# Patient Record
Sex: Female | Born: 1960 | Race: White | Hispanic: No | Smoking: Never smoker
Health system: Southern US, Community
[De-identification: ages and names within clinical notes are randomized; demographics above are authoritative.]

## PROBLEM LIST (undated history)

## (undated) DIAGNOSIS — E785 Hyperlipidemia, unspecified: Secondary | ICD-10-CM

## (undated) DIAGNOSIS — E079 Disorder of thyroid, unspecified: Secondary | ICD-10-CM

## (undated) DIAGNOSIS — E119 Type 2 diabetes mellitus without complications: Secondary | ICD-10-CM

## (undated) DIAGNOSIS — G2581 Restless legs syndrome: Secondary | ICD-10-CM

## (undated) HISTORY — DX: Disorder of thyroid, unspecified: E07.9

## (undated) HISTORY — DX: Restless legs syndrome: G25.81

## (undated) HISTORY — DX: Hyperlipidemia, unspecified: E78.5

## (undated) HISTORY — DX: Type 2 diabetes mellitus without complications: E11.9

---

## 1997-09-17 HISTORY — PX: TUBAL LIGATION: SHX77

## 2004-07-24 HISTORY — PX: OTHER SURGICAL HISTORY: SHX169

## 2005-03-08 ENCOUNTER — Ambulatory Visit: Payer: Self-pay | Admitting: Family Medicine

## 2006-09-17 HISTORY — PX: GASTRIC BYPASS: SHX52

## 2009-12-29 ENCOUNTER — Ambulatory Visit: Payer: Self-pay | Admitting: Unknown Physician Specialty

## 2010-03-07 ENCOUNTER — Ambulatory Visit: Payer: Self-pay

## 2013-08-13 ENCOUNTER — Emergency Department: Payer: Self-pay | Admitting: Emergency Medicine

## 2013-08-14 DIAGNOSIS — Z8679 Personal history of other diseases of the circulatory system: Secondary | ICD-10-CM | POA: Insufficient documentation

## 2013-08-14 DIAGNOSIS — S12000A Unspecified displaced fracture of first cervical vertebra, initial encounter for closed fracture: Secondary | ICD-10-CM | POA: Insufficient documentation

## 2013-11-04 LAB — HM PAP SMEAR

## 2014-04-23 ENCOUNTER — Ambulatory Visit: Payer: Self-pay | Admitting: Gastroenterology

## 2014-04-23 LAB — HM COLONOSCOPY

## 2014-04-26 LAB — PATHOLOGY REPORT

## 2015-01-05 LAB — BASIC METABOLIC PANEL
BUN: 7 mg/dL (ref 4–21)
Creatinine: 0.7 mg/dL (ref 0.5–1.1)
Glucose: 137 mg/dL
Potassium: 4.8 mmol/L (ref 3.4–5.3)
Sodium: 137 mmol/L (ref 137–147)

## 2015-01-05 LAB — HEPATIC FUNCTION PANEL: ALT: 17 U/L (ref 7–35)

## 2015-01-05 LAB — CBC AND DIFFERENTIAL
HCT: 42 % (ref 36–46)
Hemoglobin: 14.6 g/dL (ref 12.0–16.0)
Platelets: 356 10*3/uL (ref 150–399)
WBC: 8.5 10^3/mL

## 2015-01-05 LAB — LIPID PANEL
Cholesterol: 160 mg/dL (ref 0–200)
HDL: 50 mg/dL (ref 35–70)
LDL Cholesterol: 57 mg/dL
Triglycerides: 264 mg/dL — AB (ref 40–160)

## 2015-01-05 LAB — TSH: TSH: 1.48 u[IU]/mL (ref 0.41–5.90)

## 2015-01-05 LAB — HEMOGLOBIN A1C: Hemoglobin A1C: 7.2

## 2015-01-10 ENCOUNTER — Other Ambulatory Visit: Payer: Self-pay | Admitting: Family Medicine

## 2015-01-10 DIAGNOSIS — E279 Disorder of adrenal gland, unspecified: Principal | ICD-10-CM

## 2015-01-10 DIAGNOSIS — E278 Other specified disorders of adrenal gland: Secondary | ICD-10-CM

## 2015-01-19 ENCOUNTER — Ambulatory Visit
Admission: RE | Admit: 2015-01-19 | Discharge: 2015-01-19 | Disposition: A | Discharge status: Home / Self Care | Payer: BLUE CROSS/BLUE SHIELD | Source: Ambulatory Visit | Attending: Family Medicine | Admitting: Family Medicine

## 2015-01-19 DIAGNOSIS — K769 Liver disease, unspecified: Secondary | ICD-10-CM | POA: Diagnosis present

## 2015-01-19 DIAGNOSIS — N281 Cyst of kidney, acquired: Secondary | ICD-10-CM | POA: Diagnosis not present

## 2015-01-19 DIAGNOSIS — Z09 Encounter for follow-up examination after completed treatment for conditions other than malignant neoplasm: Secondary | ICD-10-CM | POA: Diagnosis present

## 2015-01-19 DIAGNOSIS — E279 Disorder of adrenal gland, unspecified: Secondary | ICD-10-CM

## 2015-01-19 DIAGNOSIS — E278 Other specified disorders of adrenal gland: Secondary | ICD-10-CM

## 2015-01-19 DIAGNOSIS — D3502 Benign neoplasm of left adrenal gland: Secondary | ICD-10-CM | POA: Diagnosis present

## 2015-01-19 MED ORDER — GADOBENATE DIMEGLUMINE 529 MG/ML IV SOLN
13.0000 mL | Freq: Once | INTRAVENOUS | Status: AC | PRN
  Administered 2015-01-19: 13 mL via INTRAVENOUS

## 2015-02-26 ENCOUNTER — Other Ambulatory Visit: Payer: Self-pay | Admitting: Family Medicine

## 2015-02-27 NOTE — Telephone Encounter (Signed)
Please call in   [facility] [phone]   [place]       Requested Medications     Medication name:  Name from pharmacy:  zolpidem (AMBIEN) 10 MG tablet ZOLPIDEM 10MG  TAB    Sig: TAKE ONE TABLET BY MOUTH AT BEDTIME AS NEEDED    Dispense: 30 tablet   Refills: 3   Start: 02/26/2015   Class: Normal    Requested on: 11/03/2014    01/31/2015 Order History and Details         Medication name:  Name from pharmacy:  FLUoxetine (PROZAC) 20 MG capsule FLUOXETINE 20MG  CAP    Sig: TAKE ONE CAPSULE BY MOUTH ONCE DAILY    Dispense: 90 capsule   Refills: 3

## 2015-03-01 NOTE — Telephone Encounter (Signed)
Rx phoned into pharmacy.

## 2015-03-27 ENCOUNTER — Other Ambulatory Visit: Payer: Self-pay | Admitting: Family Medicine

## 2015-05-29 ENCOUNTER — Other Ambulatory Visit: Payer: Self-pay | Admitting: Family Medicine

## 2015-06-30 ENCOUNTER — Other Ambulatory Visit: Payer: Self-pay | Admitting: Family Medicine

## 2015-06-30 NOTE — Telephone Encounter (Signed)
Please call in zolpidem  

## 2015-06-30 NOTE — Telephone Encounter (Signed)
Rx called in to pharmacy. 

## 2015-07-27 ENCOUNTER — Other Ambulatory Visit: Payer: Self-pay | Admitting: Family Medicine

## 2015-08-30 ENCOUNTER — Other Ambulatory Visit: Payer: Self-pay | Admitting: Family Medicine

## 2015-08-30 DIAGNOSIS — I10 Essential (primary) hypertension: Secondary | ICD-10-CM

## 2015-08-30 DIAGNOSIS — E119 Type 2 diabetes mellitus without complications: Secondary | ICD-10-CM

## 2015-08-30 DIAGNOSIS — E039 Hypothyroidism, unspecified: Secondary | ICD-10-CM

## 2015-08-30 DIAGNOSIS — E785 Hyperlipidemia, unspecified: Secondary | ICD-10-CM

## 2015-09-09 DIAGNOSIS — E1169 Type 2 diabetes mellitus with other specified complication: Secondary | ICD-10-CM | POA: Insufficient documentation

## 2015-09-09 DIAGNOSIS — E039 Hypothyroidism, unspecified: Secondary | ICD-10-CM | POA: Insufficient documentation

## 2015-09-09 DIAGNOSIS — I10 Essential (primary) hypertension: Secondary | ICD-10-CM | POA: Insufficient documentation

## 2015-09-09 DIAGNOSIS — E785 Hyperlipidemia, unspecified: Secondary | ICD-10-CM | POA: Insufficient documentation

## 2015-09-09 NOTE — Telephone Encounter (Signed)
Please advise patient she is due for follow up visit. Have sent 30 day refill for medications, be we need to see her before next refill. Thank.s

## 2015-09-09 NOTE — Telephone Encounter (Signed)
Last OV 12/2014  Thanks,   -Laura  

## 2015-09-22 DIAGNOSIS — G4700 Insomnia, unspecified: Secondary | ICD-10-CM | POA: Insufficient documentation

## 2015-09-22 DIAGNOSIS — G2581 Restless legs syndrome: Secondary | ICD-10-CM | POA: Insufficient documentation

## 2015-09-22 DIAGNOSIS — O24429 Gestational diabetes mellitus in childbirth, unspecified control: Secondary | ICD-10-CM | POA: Insufficient documentation

## 2015-09-22 DIAGNOSIS — S12000A Unspecified displaced fracture of first cervical vertebra, initial encounter for closed fracture: Secondary | ICD-10-CM | POA: Insufficient documentation

## 2015-09-22 DIAGNOSIS — R2231 Localized swelling, mass and lump, right upper limb: Secondary | ICD-10-CM | POA: Insufficient documentation

## 2015-09-22 DIAGNOSIS — E611 Iron deficiency: Secondary | ICD-10-CM | POA: Insufficient documentation

## 2015-09-22 DIAGNOSIS — J309 Allergic rhinitis, unspecified: Secondary | ICD-10-CM | POA: Insufficient documentation

## 2015-09-22 DIAGNOSIS — E278 Other specified disorders of adrenal gland: Secondary | ICD-10-CM | POA: Insufficient documentation

## 2015-09-22 DIAGNOSIS — B351 Tinea unguium: Secondary | ICD-10-CM | POA: Insufficient documentation

## 2015-09-22 DIAGNOSIS — Z8679 Personal history of other diseases of the circulatory system: Secondary | ICD-10-CM | POA: Insufficient documentation

## 2015-09-23 ENCOUNTER — Ambulatory Visit (INDEPENDENT_AMBULATORY_CARE_PROVIDER_SITE_OTHER): Payer: BLUE CROSS/BLUE SHIELD | Admitting: Family Medicine

## 2015-09-23 ENCOUNTER — Encounter: Payer: Self-pay | Admitting: Family Medicine

## 2015-09-23 VITALS — BP 118/82 | HR 86 | Temp 98.2°F | Resp 16 | Wt 148.0 lb

## 2015-09-23 DIAGNOSIS — E785 Hyperlipidemia, unspecified: Secondary | ICD-10-CM

## 2015-09-23 DIAGNOSIS — R059 Cough, unspecified: Secondary | ICD-10-CM

## 2015-09-23 DIAGNOSIS — I10 Essential (primary) hypertension: Secondary | ICD-10-CM | POA: Diagnosis not present

## 2015-09-23 DIAGNOSIS — E611 Iron deficiency: Secondary | ICD-10-CM

## 2015-09-23 DIAGNOSIS — E119 Type 2 diabetes mellitus without complications: Secondary | ICD-10-CM | POA: Diagnosis not present

## 2015-09-23 DIAGNOSIS — J329 Chronic sinusitis, unspecified: Secondary | ICD-10-CM | POA: Diagnosis not present

## 2015-09-23 DIAGNOSIS — E039 Hypothyroidism, unspecified: Secondary | ICD-10-CM | POA: Diagnosis not present

## 2015-09-23 DIAGNOSIS — R05 Cough: Secondary | ICD-10-CM | POA: Diagnosis not present

## 2015-09-23 LAB — POC INFLUENZA A&B (BINAX/QUICKVUE)
Influenza A, POC: NEGATIVE
Influenza B, POC: NEGATIVE

## 2015-09-23 MED ORDER — HYDROCOD POLST-CPM POLST ER 10-8 MG/5ML PO SUER: 5.0000 mL | Freq: Two times a day (BID) | ORAL | Status: DC | PRN

## 2015-09-23 MED ORDER — AZITHROMYCIN 250 MG PO TABS: ORAL_TABLET | ORAL | Status: AC

## 2015-09-23 NOTE — Progress Notes (Signed)
Patient ID: Barbara Harper, female   DOB: 15-Apr-1961, 55 y.o.   MRN: [id number]       Patient: Barbara Harper Female    DOB: 1961/01/04   54 y.o.   MRN: [id number] Visit Date: 09/23/2015  Today's Provider: Lelon Huh, MD   Chief Complaint  Patient presents with  . URI    for about 2 weeks   Subjective:    HPI Pt is here for URI symptoms. She reports that they have been going on for about 2 weeks on and off. She has cough that is keeping her up at night, has made her chest sore and has green sputum. She also has sinus congestion, fever, sore throat, ear pain, sinus pain, pressure and headaches.  She started having fever up to 101 last night. Has been taking Advil over night and this morning.     Allergies  Allergen Reactions  . Multivitamin  [Centrum]     in 20's due to trace element in MV that caused joint swelling per pt has resolved  . Penicillin G Hives   Previous Medications   FLUOXETINE (PROZAC) 20 MG CAPSULE    TAKE ONE CAPSULE BY MOUTH ONCE DAILY   GABAPENTIN (NEURONTIN) 300 MG CAPSULE    Take 1 capsule by mouth at bedtime.   LEVOTHYROXINE (SYNTHROID, LEVOTHROID) 125 MCG TABLET    TAKE ONE TABLET BY MOUTH ONCE DAILY   LISINOPRIL-HYDROCHLOROTHIAZIDE (PRINZIDE,ZESTORETIC) 10-12.5 MG TABLET    TAKE ONE TABLET BY MOUTH ONCE DAILY   LOVASTATIN (MEVACOR) 40 MG TABLET    TAKE ONE TABLET BY MOUTH ONCE DAILY   METFORMIN (GLUCOPHAGE) 1000 MG TABLET    TAKE ONE TABLET BY MOUTH TWICE DAILY *SCHEDULE OFFICE VISIT FOR FOLLOW UP*   MULTIPLE VITAMINS-MINERALS (MULTIVITAMIN ADULT PO)    Take 1 tablet by mouth daily.   VITAMIN B-12 (CYANOCOBALAMIN) 1000 MCG TABLET    Take 1 tablet by mouth daily.   ZOLPIDEM (AMBIEN) 10 MG TABLET    TAKE ONE TABLET BY MOUTH AT BEDTIME AS NEEDED    Review of Systems  Constitutional: Positive for fever and fatigue.  HENT: Positive for congestion, ear pain, postnasal drip, rhinorrhea, sinus pressure and sore throat.   Eyes: Negative.   Respiratory: Positive  for cough.   Cardiovascular: Negative.   Gastrointestinal: Negative.   Endocrine: Negative.   Genitourinary: Negative.   Musculoskeletal: Positive for myalgias.  Skin: Negative.   Allergic/Immunologic: Negative.   Neurological: Negative.   Hematological: Negative.   Psychiatric/Behavioral: Negative.     Social History  Substance Use Topics  . Smoking status: Never Smoker   . Smokeless tobacco: Not on file  . Alcohol Use: 0.0 oz/week    0 Standard drinks or equivalent per week     Comment: DRINKS WINE   Objective:   BP 118/82 mmHg  Pulse 86  Temp(Src) 98.2 F (36.8 C) (Oral)  Resp 16  Wt 148 lb (67.132 kg)  SpO2 99%  Physical Exam  General Appearance:    Alert, cooperative, no distress  HENT:   bilateral TM normal without fluid or infection, neck without nodes, Frontal and maxillary sinus tender and nasal mucosa pale and congested  Eyes:    PERRL, conjunctiva/corneas clear, EOM's intact       Lungs:     Clear to auscultation bilaterally, respirations unlabored  Heart:    Regular rate and rhythm  Neurologic:   Awake, alert, oriented x 3. No apparent focal neurological  defect.       Results for orders placed or performed in visit on 09/23/15  POC Influenza A&B  Result Value Ref Range   Influenza A, POC Negative Negative   Influenza B, POC Negative Negative       Assessment & Plan:     1. Sinusitis, unspecified chronicity, unspecified location  - azithromycin (ZITHROMAX) 250 MG tablet; 2 by mouth today, then 1 daily for 4 days  Dispense: 6 tablet; Refill: 0  Call if symptoms change or if not rapidly improving.    2. Cough  - POC Influenza A&B - chlorpheniramine-HYDROcodone (TUSSIONEX PENNKINETIC ER) 10-8 MG/5ML SUER; Take 5 mLs by mouth every 12 (twelve) hours as needed for cough.  Dispense: 115 mL; Refill: 0   She is overdue for labs for chronic medical conditions. Lab order was printed for her to return to have labs drawn fasting, and to schedule  follow up o.v. Afterwords.   3. Hypothyroidism, unspecified hypothyroidism type  - TSH  4. Essential hypertension   5. HLD (hyperlipidemia)  - Lipid panel  6. Iron deficiency  - CBC  7. Type 2 diabetes mellitus without complication, unspecified long term insulin use status (HCC)  - Renal function panel - Hemoglobin A1c       Lelon Huh, MD  [facility]

## 2015-09-24 ENCOUNTER — Other Ambulatory Visit: Payer: Self-pay | Admitting: Family Medicine

## 2015-09-27 ENCOUNTER — Encounter: Payer: Self-pay | Admitting: Family Medicine

## 2015-09-28 ENCOUNTER — Other Ambulatory Visit: Payer: Self-pay | Admitting: *Deleted

## 2015-09-28 MED ORDER — ZOLPIDEM TARTRATE 10 MG PO TABS
10.0000 mg | ORAL_TABLET | Freq: Every evening | ORAL | Status: DC | PRN
Start: 2015-09-28 — End: 2016-01-23

## 2015-09-28 NOTE — Telephone Encounter (Signed)
RX called in at [facility] pharmacy  

## 2015-09-28 NOTE — Telephone Encounter (Signed)
Please call in zolpidem  

## 2015-10-01 LAB — RENAL FUNCTION PANEL
Albumin: 4.2 g/dL (ref 3.5–5.5)
BUN/Creatinine Ratio: 14 (ref 9–23)
BUN: 10 mg/dL (ref 6–24)
CO2: 25 mmol/L (ref 18–29)
Calcium: 9.5 mg/dL (ref 8.7–10.2)
Chloride: 99 mmol/L (ref 96–106)
Creatinine, Ser: 0.73 mg/dL (ref 0.57–1.00)
GFR calc Af Amer: 107 mL/min/{1.73_m2} (ref >59)
GFR calc non Af Amer: 93 mL/min/{1.73_m2} (ref >59)
Glucose: 146 mg/dL — ABNORMAL HIGH (ref 65–99)
Phosphorus: 4 mg/dL (ref 2.5–4.5)
Potassium: 4.7 mmol/L (ref 3.5–5.2)
Sodium: 141 mmol/L (ref 134–144)

## 2015-10-01 LAB — LIPID PANEL
Chol/HDL Ratio: 4.3 ratio units (ref 0.0–4.4)
Cholesterol, Total: 175 mg/dL (ref 100–199)
HDL: 41 mg/dL (ref >39)
LDL Calculated: 83 mg/dL (ref 0–99)
Triglycerides: 257 mg/dL — ABNORMAL HIGH (ref 0–149)
VLDL Cholesterol Cal: 51 mg/dL — ABNORMAL HIGH (ref 5–40)

## 2015-10-01 LAB — HEMOGLOBIN A1C
Est. average glucose Bld gHb Est-mCnc: 163 mg/dL
Hgb A1c MFr Bld: 7.3 % — ABNORMAL HIGH (ref 4.8–5.6)

## 2015-10-01 LAB — CBC
Hematocrit: 40.7 % (ref 34.0–46.6)
Hemoglobin: 13.8 g/dL (ref 11.1–15.9)
MCH: 30.9 pg (ref 26.6–33.0)
MCHC: 33.9 g/dL (ref 31.5–35.7)
MCV: 91 fL (ref 79–97)
Platelets: 456 10*3/uL — ABNORMAL HIGH (ref 150–379)
RBC: 4.47 x10E6/uL (ref 3.77–5.28)
RDW: 12.6 % (ref 12.3–15.4)
WBC: 8.8 10*3/uL (ref 3.4–10.8)

## 2015-10-01 LAB — TSH: TSH: 8.03 u[IU]/mL — ABNORMAL HIGH (ref 0.450–4.500)

## 2015-10-03 ENCOUNTER — Encounter: Payer: Self-pay | Admitting: *Deleted

## 2015-10-04 ENCOUNTER — Telehealth: Payer: Self-pay | Admitting: *Deleted

## 2015-10-04 DIAGNOSIS — E039 Hypothyroidism, unspecified: Secondary | ICD-10-CM

## 2015-10-04 NOTE — Telephone Encounter (Signed)
-----   Message from [name], MD sent at 10/01/2015  8:06 AM EST ----- A1c is stable at 7.3. Need to increase levothyroxine to 148mcg daily #30, rf x 1. Cholesterol is pretty good at 175. Need to schedule follow up o.v. In 1 month.

## 2015-10-04 NOTE — Telephone Encounter (Signed)
Should stay on the 125 and recheck TSH in 3-4 weeks.

## 2015-10-04 NOTE — Telephone Encounter (Signed)
Patient notified. TSH ordered. Lap slip is at front desk.

## 2015-10-04 NOTE — Telephone Encounter (Signed)
Called pt with her results. Patient stated that she had lost her rx for levothyroxine 120 mg and had not taken any for 2 weeks prior to her labs being drawn. Patient stated that she found her rx Friday 09/30/15 and has started taking them again. Patient wants to know if Dr. Caryn Section wants to recheck TSH in 1 month with her taking the 120 mg or if he wants her to go ahead and change to the 150 mg? Please advise?

## 2015-11-10 ENCOUNTER — Other Ambulatory Visit: Payer: Self-pay | Admitting: Family Medicine

## 2015-11-10 DIAGNOSIS — E119 Type 2 diabetes mellitus without complications: Secondary | ICD-10-CM

## 2015-12-07 ENCOUNTER — Other Ambulatory Visit: Payer: Self-pay | Admitting: Family Medicine

## 2016-01-23 ENCOUNTER — Other Ambulatory Visit: Payer: Self-pay | Admitting: Family Medicine

## 2016-01-24 ENCOUNTER — Other Ambulatory Visit: Payer: Self-pay | Admitting: Family Medicine

## 2016-01-24 NOTE — Telephone Encounter (Signed)
Please call in zolpidem  

## 2016-01-24 NOTE — Telephone Encounter (Signed)
Please call in zolpidem  Also, she needs to scheduled follow up for diabetes and hypothyroid.

## 2016-01-24 NOTE — Telephone Encounter (Signed)
Rx called into pharmacy. Patient was notified that she needs f/u. Patient stated that she will cb to schedule appt.

## 2016-01-24 NOTE — Telephone Encounter (Signed)
Already call rx into pharmacy this morning.Marland Kitchen

## 2016-01-31 ENCOUNTER — Other Ambulatory Visit: Payer: Self-pay | Admitting: Family Medicine

## 2016-03-02 ENCOUNTER — Other Ambulatory Visit: Payer: Self-pay | Admitting: Family Medicine

## 2016-03-02 ENCOUNTER — Ambulatory Visit (INDEPENDENT_AMBULATORY_CARE_PROVIDER_SITE_OTHER): Payer: BLUE CROSS/BLUE SHIELD | Admitting: Family Medicine

## 2016-03-02 ENCOUNTER — Encounter: Payer: Self-pay | Admitting: Family Medicine

## 2016-03-02 VITALS — BP 120/70 | HR 98 | Temp 98.0°F | Resp 16 | Wt 148.0 lb

## 2016-03-02 DIAGNOSIS — E611 Iron deficiency: Secondary | ICD-10-CM | POA: Diagnosis not present

## 2016-03-02 DIAGNOSIS — E039 Hypothyroidism, unspecified: Secondary | ICD-10-CM

## 2016-03-02 DIAGNOSIS — I10 Essential (primary) hypertension: Secondary | ICD-10-CM

## 2016-03-02 DIAGNOSIS — E119 Type 2 diabetes mellitus without complications: Secondary | ICD-10-CM | POA: Diagnosis not present

## 2016-03-02 LAB — POCT GLYCOSYLATED HEMOGLOBIN (HGB A1C)
Est. average glucose Bld gHb Est-mCnc: 192
Hemoglobin A1C: 8.3

## 2016-03-02 MED ORDER — LISINOPRIL-HYDROCHLOROTHIAZIDE 10-12.5 MG PO TABS: 1.0000 | ORAL_TABLET | Freq: Every day | ORAL | Status: DC

## 2016-03-02 MED ORDER — GLUCOSE BLOOD VI STRP: ORAL_STRIP | Status: AC

## 2016-03-02 MED ORDER — ONETOUCH ULTRA 2 W/DEVICE KIT: PACK | Status: AC

## 2016-03-02 NOTE — Telephone Encounter (Signed)
Rx called in to pharmacy. 

## 2016-03-02 NOTE — Telephone Encounter (Signed)
Please call in zolpidem  

## 2016-03-02 NOTE — Progress Notes (Signed)
Patient: Barbara Harper Female    DOB: 1961/01/21   55 y.o.   MRN: [id number] Visit Date: 03/02/2016  Today's Provider: Lelon Huh, MD   Chief Complaint  Patient presents with  . Follow-up  . Diabetes  . Hypertension  . Hypothyroidism   Subjective:    HPI  Hypothyroidism, unspecified hypothyroidism type: From-09/23/2015; no changes.    Diabetes Mellitus Type II, Follow-up:   Lab Results  Component Value Date   HGBA1C 7.3* 09/30/2015   HGBA1C 7.2 01/05/2015   Last seen for diabetes 5 months ago.  Management since then includes; no changes. She reports fair compliance with treatment. He has only been taking metformin once a day instead of twice as prescribedd.  She is not having side effects. none Current symptoms include none and have been unchanged. Home blood sugar records: fasting range: not checking  Episodes of hypoglycemia? no   Current Insulin Regimen: n/a Most Recent Eye Exam: due Weight trend: stable Prior visit with dietician: no Current diet: well balanced Current exercise: none  ----------------------------------------------------------------------   Hypertension, follow-up:  BP Readings from Last 3 Encounters:  03/02/16 120/70  09/23/15 118/82    She was last seen for hypertension 5 months ago.  BP at that visit was 118/82. Management since that visit includes; no changes.She reports good compliance with treatment. She is not having side effects. none  She is not exercising. She is not adherent to low salt diet.   Outside blood pressures are n/a. She is experiencing none.  Patient denies none.   Cardiovascular risk factors include diabetes mellitus.  Use of agents associated with hypertension: none.   ----------------------------------------------------------------------     Allergies  Allergen Reactions  . Multivitamin  [Centrum]     in 20's due to trace element in MV that caused joint swelling per pt has resolved  .  Penicillin G Hives   Current Meds  Medication Sig  . FLUoxetine (PROZAC) 20 MG capsule TAKE ONE CAPSULE BY MOUTH ONCE DAILY  . gabapentin (NEURONTIN) 300 MG capsule Take 1 capsule by mouth at bedtime.  Marland Kitchen levothyroxine (SYNTHROID, LEVOTHROID) 125 MCG tablet TAKE ONE TABLET BY MOUTH ONCE DAILY  . lisinopril-hydrochlorothiazide (PRINZIDE,ZESTORETIC) 10-12.5 MG tablet TAKE ONE TABLET BY MOUTH ONCE DAILY  . lovastatin (MEVACOR) 40 MG tablet One tablet daily. Needs to schedule office visit  . metFORMIN (GLUCOPHAGE) 1000 MG tablet TAKE ONE TABLET BY MOUTH TWICE DAILY (Only taking once a day)  . Multiple Vitamins-Minerals (MULTIVITAMIN ADULT PO) Take 1 tablet by mouth daily.  . vitamin B-12 (CYANOCOBALAMIN) 1000 MCG tablet Take 1 tablet by mouth daily.  Marland Kitchen zolpidem (AMBIEN) 10 MG tablet TAKE ONE TABLET BY MOUTH AT BEDTIME AS NEEDED    Review of Systems  Constitutional: Negative for fever, chills, appetite change and fatigue.  Respiratory: Negative for chest tightness and shortness of breath.   Cardiovascular: Negative for chest pain and palpitations.  Gastrointestinal: Negative for nausea, vomiting and abdominal pain.  Allergic/Immunologic: Positive for environmental allergies.  Neurological: Negative for dizziness and weakness.    Social History  Substance Use Topics  . Smoking status: Never Smoker   . Smokeless tobacco: Not on file  . Alcohol Use: 0.0 oz/week    0 Standard drinks or equivalent per week     Comment: DRINKS WINE   Objective:   BP 120/70 mmHg  Pulse 98  Temp(Src) 98 F (36.7 C) (Oral)  Resp 16  Wt 148 lb (67.132 kg)  Physical  Exam    Results for orders placed or performed in visit on 03/02/16  POCT glycosylated hemoglobin (Hb A1C)  Result Value Ref Range   Hemoglobin A1C 8.3    Est. average glucose Bld gHb Est-mCnc 192        Assessment & Plan:     1. Diabetes mellitus without complication ([place]) Well controlled.  Continue current medications.   - POCT  glycosylated hemoglobin (Hb A1C)  2. Iron deficiency  - Ferritin  3. Type 2 diabetes mellitus without complication, unspecified long term insulin use status ([place])   4. Hypothyroidism, unspecified hypothyroidism type Due to check TSH since increasing dose of levothyroxine.   - TSH  5. Essential hypertension Well controlled.  Continue current medications.   - Renal function panel     The entirety of the information documented in the History of Present Illness, Review of Systems and Physical Exam were personally obtained by me. Portions of this information were initially documented by April M. Sabra Heck, CMA and reviewed by me for thoroughness and accuracy.    Lelon Huh, MD  [facility]

## 2016-03-03 LAB — RENAL FUNCTION PANEL
Albumin: 4.5 g/dL (ref 3.5–5.5)
BUN/Creatinine Ratio: 15 (ref 9–23)
BUN: 10 mg/dL (ref 6–24)
CO2: 25 mmol/L (ref 18–29)
Calcium: 9.7 mg/dL (ref 8.7–10.2)
Chloride: 100 mmol/L (ref 96–106)
Creatinine, Ser: 0.67 mg/dL (ref 0.57–1.00)
GFR calc Af Amer: 114 mL/min/{1.73_m2} (ref >59)
GFR calc non Af Amer: 99 mL/min/{1.73_m2} (ref >59)
Glucose: 186 mg/dL — ABNORMAL HIGH (ref 65–99)
Phosphorus: 4.6 mg/dL — ABNORMAL HIGH (ref 2.5–4.5)
Potassium: 4.3 mmol/L (ref 3.5–5.2)
Sodium: 142 mmol/L (ref 134–144)

## 2016-03-03 LAB — FERRITIN: Ferritin: 61 ng/mL (ref 15–150)

## 2016-03-03 LAB — TSH: TSH: 0.157 u[IU]/mL — ABNORMAL LOW (ref 0.450–4.500)

## 2016-03-04 ENCOUNTER — Other Ambulatory Visit: Payer: Self-pay | Admitting: Family Medicine

## 2016-03-04 MED ORDER — LEVOTHYROXINE SODIUM 125 MCG PO TABS: 125.0000 ug | ORAL_TABLET | Freq: Every day | ORAL | Status: DC

## 2016-03-05 ENCOUNTER — Other Ambulatory Visit: Payer: Self-pay | Admitting: *Deleted

## 2016-03-05 MED ORDER — METFORMIN HCL 1000 MG PO TABS: 1000.0000 mg | ORAL_TABLET | Freq: Two times a day (BID) | ORAL | Status: DC

## 2016-03-09 ENCOUNTER — Other Ambulatory Visit: Payer: Self-pay | Admitting: *Deleted

## 2016-03-09 ENCOUNTER — Other Ambulatory Visit: Payer: Self-pay | Admitting: Family Medicine

## 2016-03-10 MED ORDER — LANCETS MISC: Status: AC

## 2016-04-10 ENCOUNTER — Other Ambulatory Visit: Payer: Self-pay | Admitting: Family Medicine

## 2016-04-10 DIAGNOSIS — E119 Type 2 diabetes mellitus without complications: Secondary | ICD-10-CM

## 2016-06-13 ENCOUNTER — Other Ambulatory Visit: Payer: Self-pay | Admitting: Family Medicine

## 2016-06-14 NOTE — Telephone Encounter (Signed)
Refill request for Zolpidem 10 mg at bedtime prn Last filled by MD on- 03/02/2016 #30 x3 Last Appt: 03/02/2016 Next Appt: none Please advise refill?

## 2016-06-14 NOTE — Telephone Encounter (Signed)
Rx called in to pharmacy. 

## 2016-07-27 ENCOUNTER — Other Ambulatory Visit: Payer: Self-pay | Admitting: Family Medicine

## 2016-08-25 IMAGING — MR MR ABDOMEN WO/W CM
10 of 19 series · 29 of 48 positions shown · IV contrast (13 ML MULTIHANCE)
Comparison: None.

CLINICAL DATA: Follow-up adrenal nodule

EXAM:
MRI ABDOMEN WITHOUT AND WITH CONTRAST
TECHNIQUE: Multiplanar multisequence MR imaging of the abdomen was performed
both before and after the administration of intravenous contrast.
CONTRAST:  13mL MULTIHANCE GADOBENATE DIMEGLUMINE 529 MG/ML IV SOLN

[Series 2: T2 · coronal · 7.0mm · 0.78mm/px · 2 of 28 slices shown (1 of 2)]
[im 1/28]
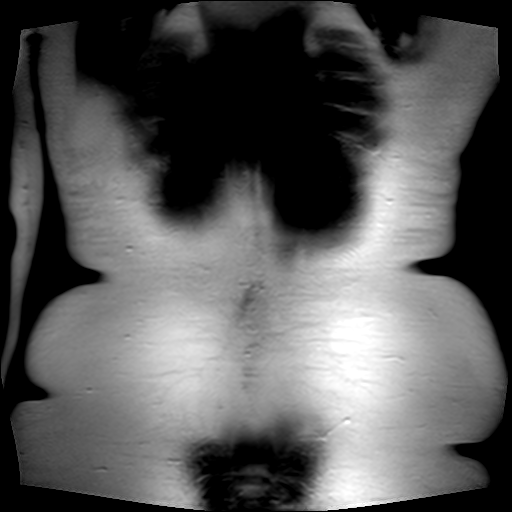
[im 28/28]
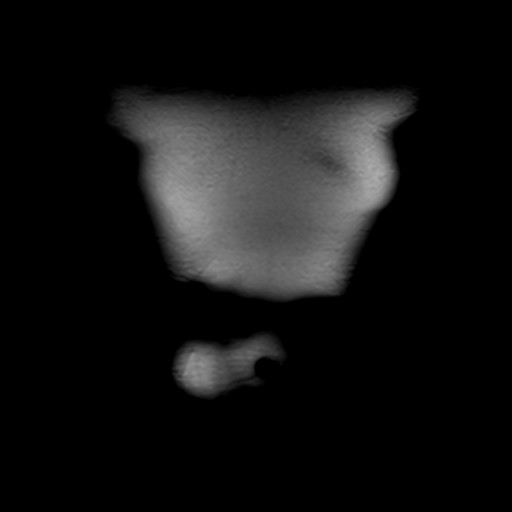

[Series 3: T2 fat-sat · axial · 7.0mm · 0.74mm/px · z∈[-50,+167]mm · 2 of 28 slices shown]
[im 1/28]
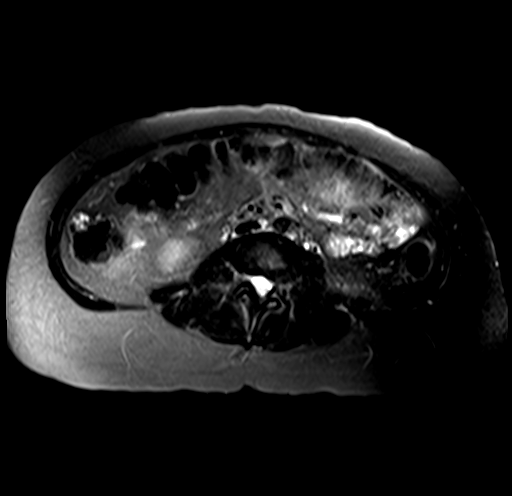
[im 28/28]
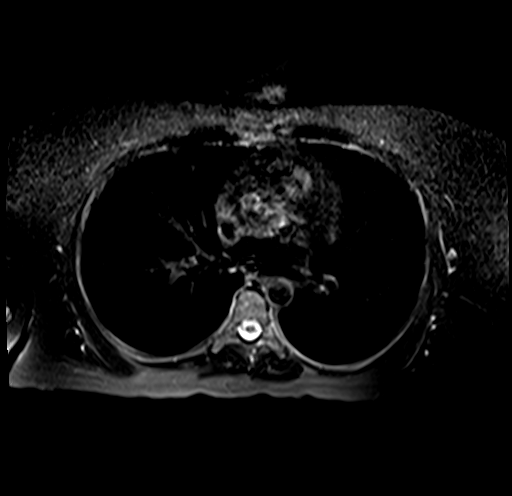

[Series 6: DWI · axial · 6.0mm · 1.98mm/px · z∈[-60,+170]mm · 6 of 99 slices shown]
[im 1/99]
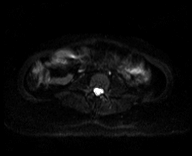
[im 20/99]
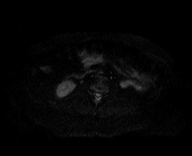
[im 40/99]
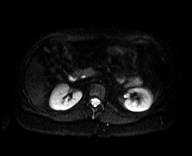
[im 59/99]
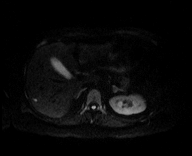
[im 79/99]
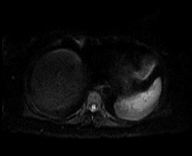
[im 99/99]
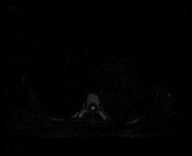

[Series 7: ax dwi_adc · axial · 6.0mm · 1.98mm/px · z∈[-60,+170]mm · 2 of 33 slices shown]
[im 1/33]
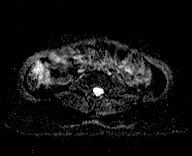
[im 33/33]
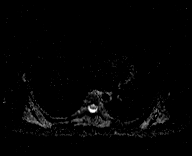

[Series 8: bSSFP · axial · 4.0mm · 0.74mm/px · z∈[-69,+167]mm · 3 of 60 slices shown]
[im 1/60]
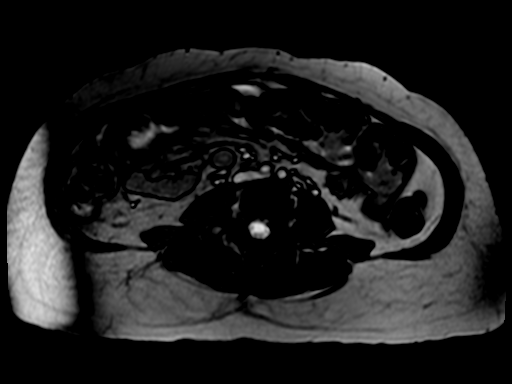
[im 30/60]
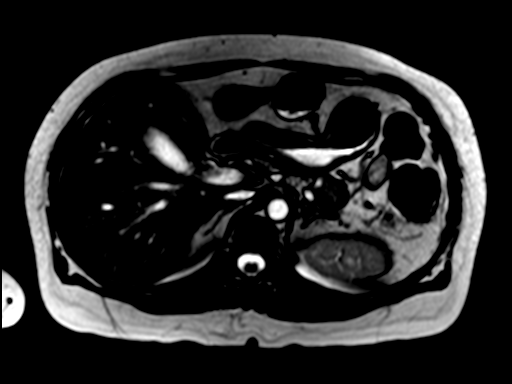
[im 60/60]
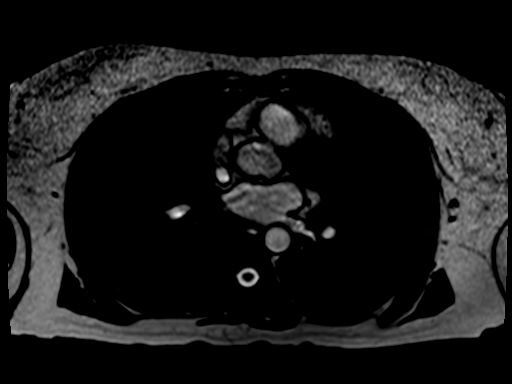

[Series 9: T1 dynamic fat-sat · axial · non-contrast · 3.5mm · 0.74mm/px · z∈[-72,+176]mm · 3 of 72 slices shown (1 of 2)]
[im 1/72]
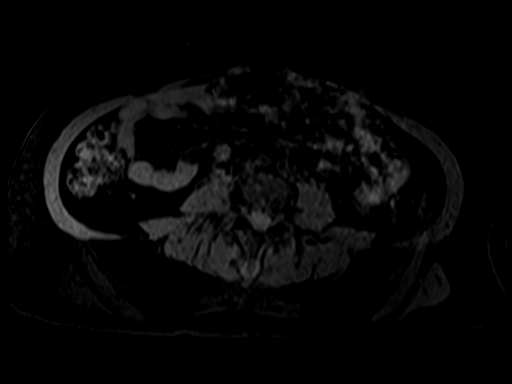
[im 36/72]
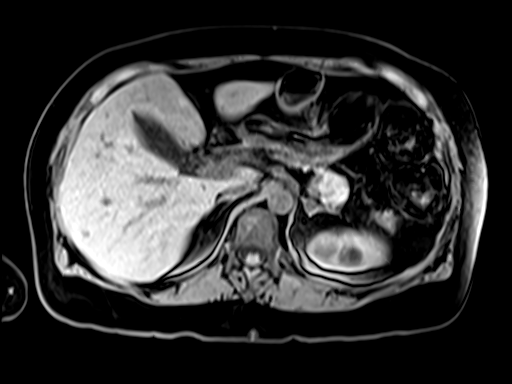
[im 72/72]
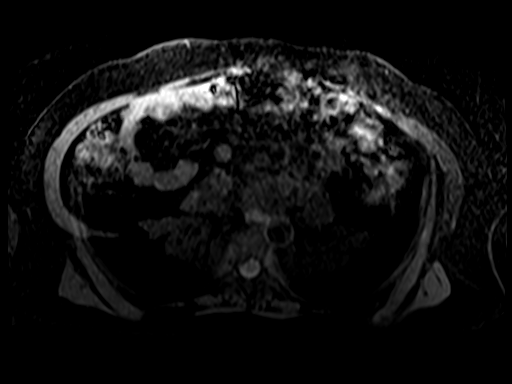

[Series 10: T1 dynamic fat-sat · axial · 3.5mm · 0.74mm/px · z∈[-72,+176]mm · 3 of 72 slices shown (2 of 2)]
[im 1/72]
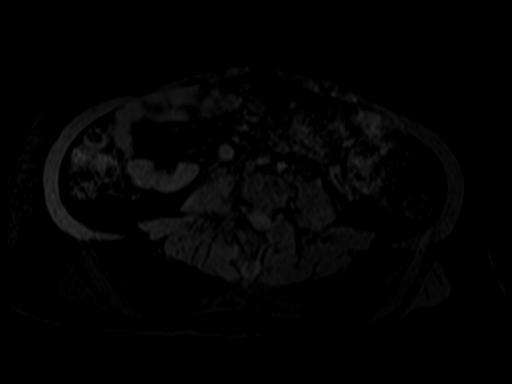
[im 36/72]
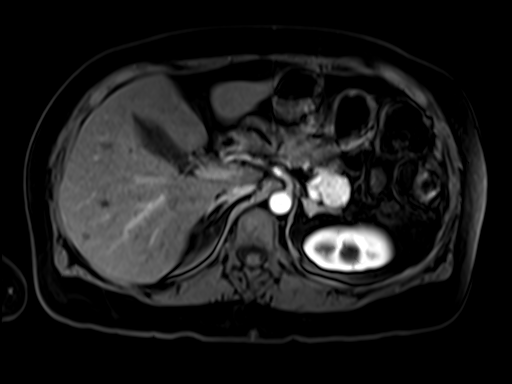
[im 72/72]
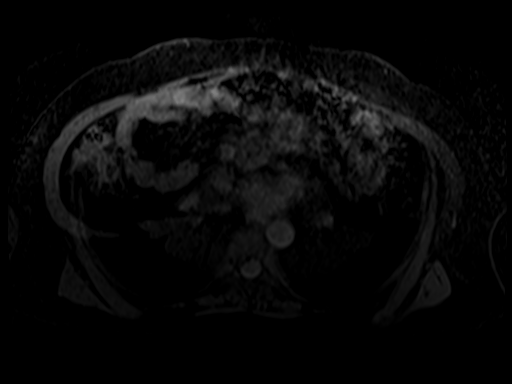

[Series 11: T1 dynamic fat-sat post-contrast · axial · 3.5mm · 0.74mm/px · z∈[-72,+176]mm · 3 of 72 slices shown (1 of 2)]
[im 1/72]
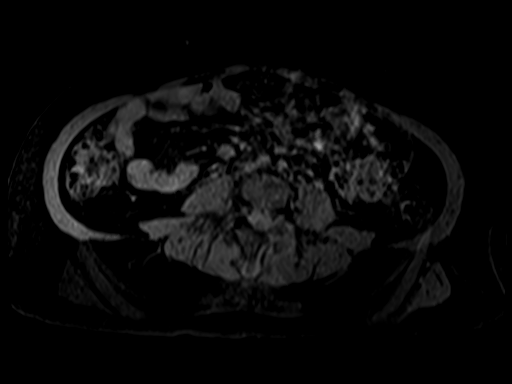
[im 36/72]
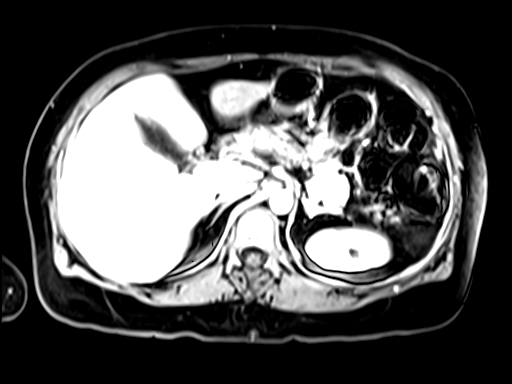
[im 72/72]
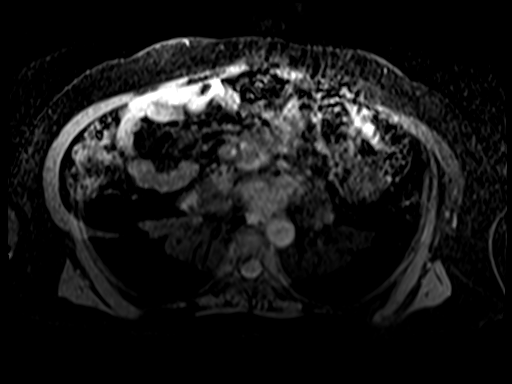

[Series 13: T1 dynamic fat-sat post-contrast · coronal · 2.5mm · 0.82mm/px · 4 of 88 slices shown (2 of 2)]
[im 1/88]
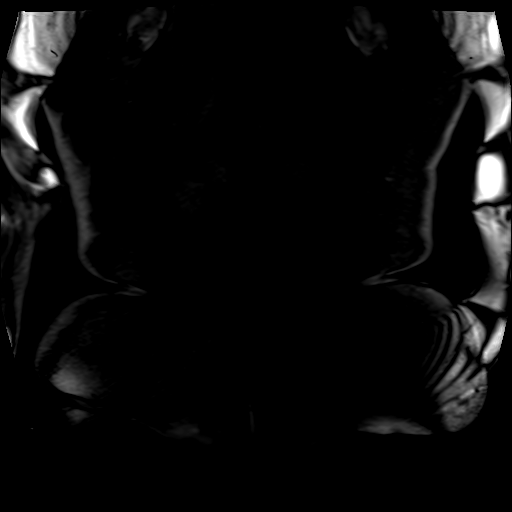
[im 30/88]
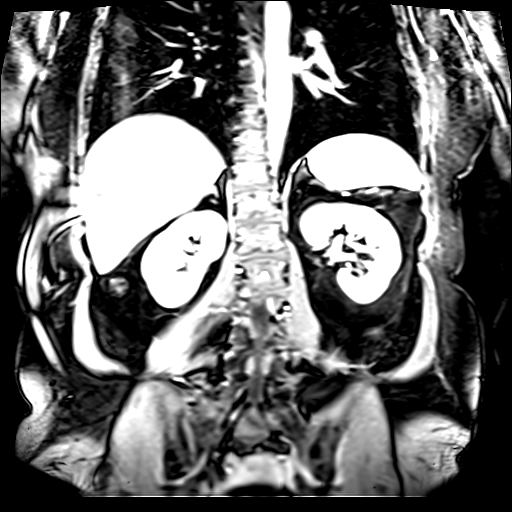
[im 59/88]
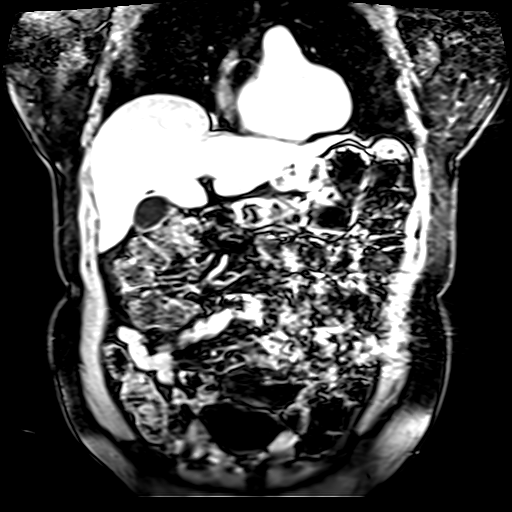
[im 88/88]
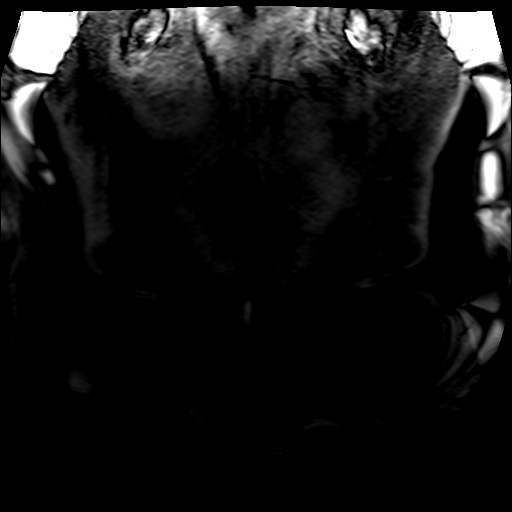

[Series 14: T2 · axial · 7.8mm · 0.74mm/px · 1 of 28 slices shown (2 of 2)]
[im 1/28]
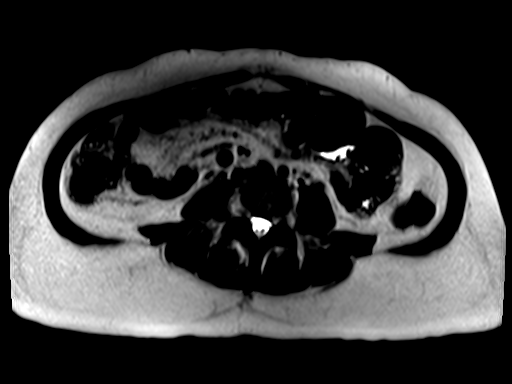

[29 of 48 positions shown; findings below may reference images not displayed]

FINDINGS: Lower chest:  Lung bases are clear.

Hepatobiliary: 5 mm cyst in the posterior segment right hepatic lobe
(series 3/ image 17). Mild hepatic steatosis.

1.7 x 2.3 cm enhancing lesion in the posterior segment right hepatic
dome with mild peripheral enhancement (series 10/image 29) and
delayed central wash-in (series 15/image 30), relatively in
conspicuous on T2. Benign FNH or adenoma is favored, less likely
benign hemangioma.

Gallbladder is unremarkable. No intrahepatic or extrahepatic ductal
dilatation.

Pancreas: Within normal limits.

Spleen: Within normal limits.

Adrenals/Urinary Tract: Right adrenal gland is within normal limits.
11 mm left adrenal nodule (series 15/image 38), with signal loss on
opposed phase imaging (series 100/image 16), compatible with a
benign adrenal adenoma.

11 mm cyst in the left upper kidney (series 3/image 15). Right
kidney is within normal limits. No hydronephrosis.

Stomach/Bowel: Postsurgical changes related to gastric bypass.

Visualized bowel is otherwise unremarkable.

Vascular/Lymphatic: No evidence abdominal aortic aneurysm.

No suspicious abdominal lymphadenopathy.

Other: No abdominal ascites.

Musculoskeletal: No focal osseous lesions.
IMPRESSION: 11 mm benign left adrenal adenoma.

2.3 cm enhancing lesion in the posterior right hepatic dome, favored
to reflect benign FNH or adenoma, less likely hemangioma.

11 mm cyst in the left upper kidney, benign (Bosniak I).

## 2016-09-27 ENCOUNTER — Other Ambulatory Visit: Payer: Self-pay | Admitting: Family Medicine

## 2016-09-27 ENCOUNTER — Other Ambulatory Visit: Payer: Self-pay | Admitting: *Deleted

## 2016-09-27 MED ORDER — METFORMIN HCL 1000 MG PO TABS: 1000.0000 mg | ORAL_TABLET | Freq: Two times a day (BID) | ORAL | 0 refills | Status: DC

## 2016-10-25 ENCOUNTER — Other Ambulatory Visit: Payer: Self-pay | Admitting: Physician Assistant

## 2016-10-26 NOTE — Telephone Encounter (Signed)
Rx called in to pharmacy. 

## 2016-10-26 NOTE — Telephone Encounter (Signed)
LOV 03/02/2016. Last refill 06/14/2016. Renaldo Fiddler, CMA

## 2016-10-26 NOTE — Telephone Encounter (Signed)
Please call in zolpidem  

## 2016-10-29 ENCOUNTER — Other Ambulatory Visit: Payer: Self-pay | Admitting: Family Medicine

## 2016-10-29 ENCOUNTER — Encounter: Payer: Self-pay | Admitting: Family Medicine

## 2016-10-29 ENCOUNTER — Ambulatory Visit (INDEPENDENT_AMBULATORY_CARE_PROVIDER_SITE_OTHER): Payer: BLUE CROSS/BLUE SHIELD | Admitting: Family Medicine

## 2016-10-29 VITALS — BP 130/78 | HR 77 | Temp 98.9°F | Resp 16 | Ht 63.0 in | Wt 146.0 lb

## 2016-10-29 DIAGNOSIS — I10 Essential (primary) hypertension: Secondary | ICD-10-CM

## 2016-10-29 DIAGNOSIS — E119 Type 2 diabetes mellitus without complications: Secondary | ICD-10-CM | POA: Diagnosis not present

## 2016-10-29 LAB — POCT GLYCOSYLATED HEMOGLOBIN (HGB A1C)
Est. average glucose Bld gHb Est-mCnc: 217
Hemoglobin A1C: 9.2

## 2016-10-29 MED ORDER — ZOLPIDEM TARTRATE 10 MG PO TABS: 10.0000 mg | ORAL_TABLET | Freq: Every evening | ORAL | 3 refills | Status: DC | PRN

## 2016-10-29 NOTE — Progress Notes (Signed)
Patient: Barbara Harper Female    DOB: October 11, 1960   56 y.o.   MRN: [id number] Visit Date: 10/29/2016  Today's Provider: Lelon Huh, MD   Chief Complaint  Patient presents with  . Follow-up  . Diabetes  . Hypertension  . Hypothyroidism   Subjective:    HPI  Hypothyroidism, unspecified hypothyroidism type From 03/02/2016-no changes.   Diabetes Mellitus Type II, Follow-up:   Lab Results  Component Value Date   HGBA1C 8.3 03/02/2016   HGBA1C 7.3 (H) 09/30/2015   HGBA1C 7.2 01/05/2015   Last seen for diabetes 8 months ago.  Management since then includes; no changes. She reports fair compliance with treatment. Has only been taking metformin once a day instead of twice because she usually forgets to take the second She is not having side effects. none Current symptoms include none and have been unchanged. Home blood sugar records: fasting range: 200  Episodes of hypoglycemia? no   Current Insulin Regimen: n/a Most Recent Eye Exam:  due Weight trend: stable Prior visit with dietician: no Current diet: well balanced Current exercise: walking  ----------------------------------------------------------------   Hypertension, follow-up:  BP Readings from Last 3 Encounters:  10/29/16 130/78  03/02/16 120/70  09/23/15 118/82    She was last seen for hypertension 8 months ago.  BP at that visit was 120/70. Management since that visit includes; no changes.She reports good compliance with treatment. She is not having side effects. none She is exercising. She is adherent to low salt diet.   Outside blood pressures are not checking. She is experiencing none.  Patient denies none.   Cardiovascular risk factors include none.  Use of agents associated with hypertension: none.   ----------------------------------------------------------------    Allergies  Allergen Reactions  . Multivitamin  [Centrum]     in 20's due to trace element in MV that caused  joint swelling per pt has resolved  . Penicillin G Hives     Current Outpatient Prescriptions:  .  Blood Glucose Monitoring Suppl (ONE TOUCH ULTRA 2) w/Device KIT, Use to check sugar daily, Disp: 1 each, Rfl: 0 .  FLUoxetine (PROZAC) 20 MG capsule, TAKE ONE CAPSULE BY MOUTH ONCE DAILY, Disp: 90 capsule, Rfl: 2 .  gabapentin (NEURONTIN) 300 MG capsule, Take 1 capsule by mouth at bedtime., Disp: , Rfl:  .  glucose blood (ONE TOUCH ULTRA TEST) test strip, Check sugar daily, Disp: 100 each, Rfl: 4 .  Lancets MISC, Use Once Daily, Disp: 100 each, Rfl: 5 .  levothyroxine (SYNTHROID, LEVOTHROID) 125 MCG tablet, TAKE ONE TABLET BY MOUTH ONCE DAILY, Disp: 90 tablet, Rfl: 0 .  lisinopril-hydrochlorothiazide (PRINZIDE,ZESTORETIC) 10-12.5 MG tablet, Take 1 tablet by mouth daily., Disp: 90 tablet, Rfl: 1 .  lovastatin (MEVACOR) 40 MG tablet, TAKE ONE TABLET BY MOUTH ONCE DAILY. NEED TO  SCHEDULE OFFICE VISIT FOR FOLLOW UP., Disp: 90 tablet, Rfl: 3 .  metFORMIN (GLUCOPHAGE) 1000 MG tablet, Take 1 tablet (1,000 mg total) by mouth 2 (two) times daily., Disp: 60 tablet, Rfl: 0 .  Multiple Vitamins-Minerals (MULTIVITAMIN ADULT PO), Take 1 tablet by mouth daily., Disp: , Rfl:  .  vitamin B-12 (CYANOCOBALAMIN) 1000 MCG tablet, Take 1 tablet by mouth daily., Disp: , Rfl:  .  zolpidem (AMBIEN) 10 MG tablet, TAKE ONE TABLET BY MOUTH AT BEDTIME AS NEEDED, Disp: 30 tablet, Rfl: 1  Review of Systems  Constitutional: Negative for appetite change, chills, fatigue and fever.  Respiratory: Negative for chest tightness and shortness  of breath.   Cardiovascular: Negative for chest pain and palpitations.  Gastrointestinal: Negative for abdominal pain, nausea and vomiting.  Neurological: Negative for dizziness and weakness.    Social History  Substance Use Topics  . Smoking status: Never Smoker  . Smokeless tobacco: Never Used  . Alcohol use 0.0 oz/week     Comment: DRINKS WINE   Objective:   BP 130/78 (BP  Location: Right Arm, Patient Position: Sitting, Cuff Size: Normal)   Pulse 77   Temp 98.9 F (37.2 C) (Oral)   Resp 16   Ht 5' 3"  (1.6 m)   Wt 146 lb (66.2 kg)   SpO2 98%   BMI 25.86 kg/m   Physical Exam   General Appearance:    Alert, cooperative, no distress  Eyes:    PERRL, conjunctiva/corneas clear, EOM's intact       Lungs:     Clear to auscultation bilaterally, respirations unlabored  Heart:    Regular rate and rhythm  Neurologic:   Awake, alert, oriented x 3. No apparent focal neurological           defect.       Results for orders placed or performed in visit on 10/29/16  POCT glycosylated hemoglobin (Hb A1C)  Result Value Ref Range   Hemoglobin A1C 9.2    Est. average glucose Bld gHb Est-mCnc 217        Assessment & Plan:     1. Diabetes mellitus without complication ([place]) Uncontrolled due to not taking second metformin consistently. She state she has no adverse effects from medication, but just forgets to take in the morning. She is going to work on taking it twice every day and return in 3-4 months. t  check a1c and maintenance labs.  - POCT glycosylated hemoglobin (Hb A1C)  2. Essential hypertension Well controlled.  Continue current medications.    Patient reports she had flu vaccine at [facility].       Lelon Huh, MD  [facility]

## 2016-10-29 NOTE — Telephone Encounter (Signed)
Please call in zolpidem  

## 2016-10-30 NOTE — Telephone Encounter (Signed)
See note below

## 2016-10-30 NOTE — Telephone Encounter (Signed)
Rx called in to pharmacy. 

## 2016-12-03 ENCOUNTER — Other Ambulatory Visit: Payer: Self-pay | Admitting: Family Medicine

## 2017-01-13 ENCOUNTER — Other Ambulatory Visit: Payer: Self-pay | Admitting: Family Medicine

## 2017-01-28 ENCOUNTER — Ambulatory Visit: Payer: BLUE CROSS/BLUE SHIELD | Admitting: Family Medicine

## 2017-05-10 ENCOUNTER — Other Ambulatory Visit: Payer: Self-pay | Admitting: Family Medicine

## 2017-05-10 ENCOUNTER — Other Ambulatory Visit: Payer: Self-pay

## 2017-05-10 MED ORDER — LOVASTATIN 40 MG PO TABS: 40.0000 mg | ORAL_TABLET | Freq: Every day | ORAL | 3 refills | Status: DC

## 2017-05-10 MED ORDER — METFORMIN HCL 1000 MG PO TABS: 1000.0000 mg | ORAL_TABLET | Freq: Two times a day (BID) | ORAL | 0 refills | Status: DC

## 2017-05-10 MED ORDER — ZOLPIDEM TARTRATE 10 MG PO TABS: 10.0000 mg | ORAL_TABLET | Freq: Every evening | ORAL | 0 refills | Status: DC | PRN

## 2017-05-10 NOTE — Telephone Encounter (Signed)
rx called in-aa 

## 2017-05-10 NOTE — Telephone Encounter (Signed)
Please call in zolpidem  

## 2017-05-10 NOTE — Telephone Encounter (Signed)
Patient needs refills on Metformin, Lovastatin, Zolpidem. Patient made appointment to follow up on 05/27/17-aa

## 2017-05-27 ENCOUNTER — Encounter: Payer: Self-pay | Admitting: Family Medicine

## 2017-05-27 ENCOUNTER — Ambulatory Visit (INDEPENDENT_AMBULATORY_CARE_PROVIDER_SITE_OTHER): Payer: BLUE CROSS/BLUE SHIELD | Admitting: Family Medicine

## 2017-05-27 VITALS — BP 124/80 | HR 86 | Temp 98.7°F | Resp 16 | Ht 63.0 in | Wt 144.0 lb

## 2017-05-27 DIAGNOSIS — Z23 Encounter for immunization: Secondary | ICD-10-CM

## 2017-05-27 DIAGNOSIS — E039 Hypothyroidism, unspecified: Secondary | ICD-10-CM | POA: Diagnosis not present

## 2017-05-27 DIAGNOSIS — E611 Iron deficiency: Secondary | ICD-10-CM

## 2017-05-27 DIAGNOSIS — E119 Type 2 diabetes mellitus without complications: Secondary | ICD-10-CM | POA: Diagnosis not present

## 2017-05-27 LAB — TSH: TSH: 0.42 mIU/L (ref 0.40–4.50)

## 2017-05-27 LAB — POCT UA - MICROALBUMIN: Microalbumin Ur, POC: 20 mg/L

## 2017-05-27 LAB — COMPLETE METABOLIC PANEL WITH GFR
AG Ratio: 1.7 (calc) (ref 1.0–2.5)
ALT: 31 U/L — ABNORMAL HIGH (ref 6–29)
AST: 18 U/L (ref 10–35)
Albumin: 4.1 g/dL (ref 3.6–5.1)
Alkaline phosphatase (APISO): 75 U/L (ref 33–130)
BUN: 9 mg/dL (ref 7–25)
CO2: 29 mmol/L (ref 20–32)
Calcium: 9.3 mg/dL (ref 8.6–10.4)
Chloride: 97 mmol/L — ABNORMAL LOW (ref 98–110)
Creat: 0.79 mg/dL (ref 0.50–1.05)
GFR, Est African American: 96 mL/min/{1.73_m2} (ref >=60)
GFR, Est Non African American: 83 mL/min/{1.73_m2} (ref >=60)
Globulin: 2.4 g/dL (calc) (ref 1.9–3.7)
Glucose, Bld: 296 mg/dL — ABNORMAL HIGH (ref 65–139)
Potassium: 4 mmol/L (ref 3.5–5.3)
Sodium: 135 mmol/L (ref 135–146)
Total Bilirubin: 0.5 mg/dL (ref 0.2–1.2)
Total Protein: 6.5 g/dL (ref 6.1–8.1)

## 2017-05-27 LAB — LIPID PANEL
Cholesterol: 197 mg/dL (ref <200)
HDL: 40 mg/dL — ABNORMAL LOW (ref >50)
LDL Cholesterol (Calc): 105 mg/dL (calc) — ABNORMAL HIGH
Non-HDL Cholesterol (Calc): 157 mg/dL (calc) — ABNORMAL HIGH (ref <130)
Total CHOL/HDL Ratio: 4.9 (calc) (ref <5.0)
Triglycerides: 391 mg/dL — ABNORMAL HIGH (ref <150)

## 2017-05-27 LAB — T4, FREE: Free T4: 1.5 ng/dL (ref 0.8–1.8)

## 2017-05-27 LAB — POCT GLYCOSYLATED HEMOGLOBIN (HGB A1C)
Est. average glucose Bld gHb Est-mCnc: 258
Hemoglobin A1C: 10.6

## 2017-05-27 LAB — FERRITIN: Ferritin: 95 ng/mL (ref 10–232)

## 2017-05-27 MED ORDER — SITAGLIPTIN PHOSPHATE 100 MG PO TABS: 100.0000 mg | ORAL_TABLET | Freq: Every day | ORAL | 3 refills | Status: DC

## 2017-05-27 NOTE — Progress Notes (Signed)
Patient: Barbara Harper Female    DOB: 07-03-1961   56 y.o.   MRN: [id number] Visit Date: 05/27/2017  Today's Provider: Lelon Huh, MD   Chief Complaint  Patient presents with  . Follow-up   Subjective:    HPI   Diabetes Mellitus Type II, Follow-up:   Lab Results  Component Value Date   HGBA1C 9.2 10/29/2016   HGBA1C 8.3 03/02/2016   HGBA1C 7.3 (H) 09/30/2015   Last seen for diabetes 7 months ago.  Management since then includes; advised to work on consistently taking metformin. She reports fair compliance with treatment. Frequently misses second dose of metformin.  She is not having side effects.   Home blood sugar records: not being checked regularly.   Episodes of hypoglycemia? no     ----------------------------------------------------------------    Hypertension, follow-up:  BP Readings from Last 3 Encounters:  10/29/16 130/78  03/02/16 120/70  09/23/15 118/82    She was last seen for hypertension 7 months ago.  BP at that visit was 130/78. Management since that visit includes; no changes.She reports good compliance with treatment. She is not having side effects.  She is exercising.   Wt Readings from Last 3 Encounters:  05/27/17 144 lb (65.3 kg)  10/29/16 146 lb (66.2 kg)  03/02/16 148 lb (67.1 kg)    ----------------------------------------------------------------  Hypothyroidism, unspecified hypothyroidism type From 03/02/2016-no changes.   Doing well with levothyroxine, tolerating well. Does feel fatigued, but no moreso than usual.   Insomnia  Currently taking Ambien.   Allergies  Allergen Reactions  . Multivitamin  [Centrum]     in 20's due to trace element in MV that caused joint swelling per pt has resolved  . Penicillin G Hives     Current Outpatient Prescriptions:  .  Blood Glucose Monitoring Suppl (ONE TOUCH ULTRA 2) w/Device KIT, Use to check sugar daily, Disp: 1 each, Rfl: 0 .  FLUoxetine (PROZAC) 20 MG capsule,  TAKE ONE CAPSULE BY MOUTH ONCE DAILY, Disp: 90 capsule, Rfl: 2 .  gabapentin (NEURONTIN) 300 MG capsule, Take 1 capsule by mouth at bedtime., Disp: , Rfl:  .  glucose blood (ONE TOUCH ULTRA TEST) test strip, Check sugar daily, Disp: 100 each, Rfl: 4 .  Lancets MISC, Use Once Daily, Disp: 100 each, Rfl: 5 .  levothyroxine (SYNTHROID, LEVOTHROID) 125 MCG tablet, Take 1 tablet (125 mcg total) by mouth daily., Disp: 90 tablet, Rfl: 1 .  lisinopril-hydrochlorothiazide (PRINZIDE,ZESTORETIC) 10-12.5 MG tablet, TAKE 1 TABLET BY MOUTH ONCE DAILY, Disp: 90 tablet, Rfl: 0 .  lovastatin (MEVACOR) 40 MG tablet, Take 1 tablet (40 mg total) by mouth daily., Disp: 90 tablet, Rfl: 3 .  metFORMIN (GLUCOPHAGE) 1000 MG tablet, Take 1 tablet (1,000 mg total) by mouth 2 (two) times daily., Disp: 60 tablet, Rfl: 0 .  Multiple Vitamins-Minerals (MULTIVITAMIN ADULT PO), Take 1 tablet by mouth daily., Disp: , Rfl:  .  vitamin B-12 (CYANOCOBALAMIN) 1000 MCG tablet, Take 1 tablet by mouth daily., Disp: , Rfl:  .  zolpidem (AMBIEN) 10 MG tablet, Take 1 tablet (10 mg total) by mouth at bedtime as needed., Disp: 30 tablet, Rfl: 0  Review of Systems  Constitutional: Negative for appetite change, chills, fatigue and fever.  Respiratory: Negative for chest tightness and shortness of breath.   Cardiovascular: Negative for chest pain and palpitations.  Gastrointestinal: Negative for abdominal pain, nausea and vomiting.  Neurological: Negative for dizziness and weakness.    Social History  Substance  Use Topics  . Smoking status: Never Smoker  . Smokeless tobacco: Never Used  . Alcohol use 0.0 oz/week     Comment: DRINKS WINE   Objective:   BP 124/80 (BP Location: Right Arm, Patient Position: Sitting, Cuff Size: Normal)   Pulse 86   Temp 98.7 F (37.1 C) (Oral)   Resp 16   Ht 5' 3"  (1.6 m)   Wt 144 lb (65.3 kg)   SpO2 98%   BMI 25.51 kg/m    Physical Exam   General Appearance:    Alert, cooperative, no distress   Eyes:    PERRL, conjunctiva/corneas clear, EOM's intact       Lungs:     Clear to auscultation bilaterally, respirations unlabored  Heart:    Regular rate and rhythm  Neurologic:   Awake, alert, oriented x 3. No apparent focal neurological           defect.       Results for orders placed or performed in visit on 05/27/17  POCT glycosylated hemoglobin (Hb A1C)  Result Value Ref Range   Hemoglobin A1C 10.6    Est. average glucose Bld gHb Est-mCnc 258   POCT UA - Microalbumin  Result Value Ref Range   Microalbumin Ur, POC 20 mg/L       Assessment & Plan:     1. Diabetes mellitus without complication (HCC) Uncontrolled. Discussed various options including GLP-1 agonist, SGLT2 inhibitors, and sulfonylureas. Will try adding Januvia first.  - POCT glycosylated hemoglobin (Hb A1C) - sitaGLIPtin (JANUVIA) 100 MG tablet; Take 1 tablet (100 mg total) by mouth daily.  Dispense: 30 tablet; Refill: 3 - COMPLETE METABOLIC PANEL WITH GFR - Lipid panel - POCT UA - Microalbumin  2. Adult hypothyroidism  - T4, free - TSH  3. Iron deficiency  - Ferritin  4. Need for influenza vaccination  - Flu Vaccine QUAD 36+ mos IM  5. Need for Tdap vaccination  - Tdap vaccine greater than or equal to 7yo IM        Lelon Huh, MD  [facility]

## 2017-05-28 ENCOUNTER — Other Ambulatory Visit: Payer: Self-pay | Admitting: *Deleted

## 2017-05-28 MED ORDER — ZOLPIDEM TARTRATE 10 MG PO TABS: 10.0000 mg | ORAL_TABLET | Freq: Every evening | ORAL | 5 refills | Status: DC | PRN

## 2017-05-28 NOTE — Telephone Encounter (Signed)
Patient is requesting an early refill for zolpidem 10 mg. Patient stated that she will be leaving 05/31/2017 in the am for vacation and will be out of town when her zolpidem is due to refill. Please advise?

## 2017-05-28 NOTE — Telephone Encounter (Signed)
Please call in zolpidem  

## 2017-05-28 NOTE — Telephone Encounter (Signed)
Done. Prescription called into the pharmacy.  

## 2017-05-29 ENCOUNTER — Telehealth: Payer: Self-pay | Admitting: Family Medicine

## 2017-05-29 NOTE — Telephone Encounter (Signed)
Pt is wanting an early refill on her ambien 10mg   Pt states her is going on vacation and needs it early.  Please advise [phone]  Thanks Con Memos

## 2017-05-29 NOTE — Telephone Encounter (Signed)
Barbara Harper confirmed they received rx for Ambien. Pharmacy advised that pt will have to pay out of pocket because her insurance will not approve refill before 06/01/17. Patient was notified.

## 2017-05-29 NOTE — Telephone Encounter (Signed)
Rx was called into pharmacy 05/28/2017 and pt was notified.

## 2017-06-17 ENCOUNTER — Encounter: Payer: Self-pay | Admitting: Family Medicine

## 2017-06-17 ENCOUNTER — Encounter: Payer: Self-pay | Admitting: *Deleted

## 2017-06-17 DIAGNOSIS — R87810 Cervical high risk human papillomavirus (HPV) DNA test positive: Secondary | ICD-10-CM | POA: Insufficient documentation

## 2017-07-26 ENCOUNTER — Telehealth: Payer: Self-pay | Admitting: Family Medicine

## 2017-07-26 NOTE — Telephone Encounter (Signed)
Patient advised. Scheduled appt for 08/09/2017 at 8:15 am.

## 2017-07-26 NOTE — Telephone Encounter (Signed)
Please advise patient she needs to schedule follow up for uncontrolled diabetes since adding Januvia in September.

## 2017-08-01 ENCOUNTER — Other Ambulatory Visit: Payer: Self-pay | Admitting: Family Medicine

## 2017-08-09 ENCOUNTER — Encounter: Payer: Self-pay | Admitting: Family Medicine

## 2017-08-09 ENCOUNTER — Ambulatory Visit (INDEPENDENT_AMBULATORY_CARE_PROVIDER_SITE_OTHER): Payer: BLUE CROSS/BLUE SHIELD | Admitting: Family Medicine

## 2017-08-09 VITALS — BP 104/60 | HR 90 | Temp 97.7°F | Resp 16 | Wt 139.0 lb

## 2017-08-09 DIAGNOSIS — E119 Type 2 diabetes mellitus without complications: Secondary | ICD-10-CM

## 2017-08-09 LAB — POCT GLYCOSYLATED HEMOGLOBIN (HGB A1C): Hemoglobin A1C: 6.8

## 2017-08-09 MED ORDER — FLUOXETINE HCL 40 MG PO CAPS: 40.0000 mg | ORAL_CAPSULE | Freq: Every day | ORAL | 4 refills | Status: DC

## 2017-08-09 MED ORDER — SITAGLIPTIN PHOSPHATE 100 MG PO TABS: 100.0000 mg | ORAL_TABLET | Freq: Every day | ORAL | 3 refills | Status: DC

## 2017-08-09 NOTE — Progress Notes (Signed)
     Patient: Barbara Harper Female    DOB: 07/22/1961   56 y.o.   MRN: [id number] Visit Date: 08/09/2017  Today's Provider: Donald [name], MD   Chief Complaint  Patient presents with  . Diabetes  . Hypothyroidism  . Anemia   Subjective:    HPI  Diabetes Mellitus Type II, Follow-up:   Lab Results  Component Value Date   HGBA1C 10.6 05/27/2017   HGBA1C 9.2 10/29/2016   HGBA1C 8.3 03/02/2016    Last seen for diabetes 2 months ago.  Management since then includes added januvia. She reports good compliance with treatment. She is not having side effects.  Current symptoms include none and have been unchanged. Home blood sugar records: not being checked recently.   Episodes of hypoglycemia? no   Current Insulin Regimen: n/a Most Recent Eye Exam: 2017 Weight trend: stable Current exercise: none  Pertinent Labs:    Component Value Date/Time   CHOL 197 05/27/2017 0926   CHOL 175 09/30/2015 0859   TRIG 391 (H) 05/27/2017 0926   HDL 40 (L) 05/27/2017 0926   HDL 41 09/30/2015 0859   LDLCALC 83 09/30/2015 0859   CREATININE 0.79 05/27/2017 0926    Wt Readings from Last 3 Encounters:  08/09/17 139 lb (63 kg)  05/27/17 144 lb (65.3 kg)  10/29/16 146 lb (66.2 kg)    ------------------------------------------------------------------------ Pt would like her next refill on metformin to be a 90 day supply.       Allergies  Allergen Reactions  . Multivitamin  [Centrum]     in 20's due to trace element in MV that caused joint swelling per pt has resolved  . Penicillin G Hives     Current Outpatient Medications:  .  aspirin 81 MG tablet, Take 81 mg by mouth daily., Disp: , Rfl:  .  Blood Glucose Monitoring Suppl (ONE TOUCH ULTRA 2) w/Device KIT, Use to check sugar daily, Disp: 1 each, Rfl: 0 .  CALCIUM PO, Take by mouth., Disp: , Rfl:  .  FLUoxetine (PROZAC) 20 MG capsule, TAKE ONE CAPSULE BY MOUTH ONCE DAILY, Disp: 90 capsule, Rfl: 2 .  gabapentin (NEURONTIN)  300 MG capsule, Take 1 capsule by mouth at bedtime., Disp: , Rfl:  .  glucose blood (ONE TOUCH ULTRA TEST) test strip, Check sugar daily, Disp: 100 each, Rfl: 4 .  IRON PO, Take by mouth., Disp: , Rfl:  .  Lancets MISC, Use Once Daily, Disp: 100 each, Rfl: 5 .  levothyroxine (SYNTHROID, LEVOTHROID) 125 MCG tablet, TAKE 1 TABLET BY MOUTH ONCE DAILY, Disp: 90 tablet, Rfl: 1 .  lisinopril-hydrochlorothiazide (PRINZIDE,ZESTORETIC) 10-12.5 MG tablet, TAKE 1 TABLET BY MOUTH ONCE DAILY, Disp: 90 tablet, Rfl: 0 .  lovastatin (MEVACOR) 40 MG tablet, Take 1 tablet (40 mg total) by mouth daily., Disp: 90 tablet, Rfl: 3 .  metFORMIN (GLUCOPHAGE) 1000 MG tablet, Take 1 tablet (1,000 mg total) by mouth 2 (two) times daily., Disp: 60 tablet, Rfl: 0 .  sitaGLIPtin (JANUVIA) 100 MG tablet, Take 1 tablet (100 mg total) by mouth daily., Disp: 30 tablet, Rfl: 3 .  vitamin B-12 (CYANOCOBALAMIN) 1000 MCG tablet, Take 1 tablet by mouth daily., Disp: , Rfl:  .  zolpidem (AMBIEN) 10 MG tablet, Take 1 tablet (10 mg total) by mouth at bedtime as needed., Disp: 30 tablet, Rfl: 5 .  Multiple Vitamins-Minerals (MULTIVITAMIN ADULT PO), Take 1 tablet by mouth daily., Disp: , Rfl:   Review of Systems  Constitutional: Negative.     HENT: Negative.   Eyes: Negative.   Respiratory: Negative.   Cardiovascular: Negative.   Gastrointestinal: Negative.   Endocrine: Negative.   Genitourinary: Negative.   Musculoskeletal: Negative.   Skin: Negative.   Allergic/Immunologic: Negative.   Neurological: Negative.   Hematological: Negative.   Psychiatric/Behavioral: Negative.     Social History   Tobacco Use  . Smoking status: Never Smoker  . Smokeless tobacco: Never Used  Substance Use Topics  . Alcohol use: Yes    Alcohol/week: 0.0 oz    Comment: DRINKS WINE   Objective:   BP 104/60 (BP Location: Left Arm, Patient Position: Sitting, Cuff Size: Normal)   Pulse 90   Temp 97.7 F (36.5 C) (Oral)   Resp 16   Wt 139 lb (63  kg)   SpO2 98%   BMI 24.62 kg/m  Vitals:   08/09/17 0823  BP: 104/60  Pulse: 90  Resp: 16  Temp: 97.7 F (36.5 C)  TempSrc: Oral  SpO2: 98%  Weight: 139 lb (63 kg)     Physical Exam   General appearance: alert, well developed, well nourished, cooperative and in no distress Head: Normocephalic, without obvious abnormality, atraumatic Respiratory: Respirations even and unlabored, normal respiratory rate Extremities: No gross deformities Skin: Skin color, texture, turgor normal. No rashes seen  Psych: Appropriate mood and affect. Neurologic: Mental status: Alert, oriented to person, place, and time, thought content appropriate.  Results for orders placed or performed in visit on 08/09/17  POCT HgB A1C  Result Value Ref Range   Hemoglobin A1C 6.8        Assessment & Plan:     1. Diabetes mellitus without complication ([place]) Much better with dietary improvements and addition of Januvia. Continue current medications.   - POCT HgB A1C - sitaGLIPtin (JANUVIA) 100 MG tablet; Take 1 tablet (100 mg total) by mouth daily.  Dispense: 90 tablet; Refill: 3  Follow up about 4 months.        Lelon Huh, MD  [facility]

## 2017-09-02 ENCOUNTER — Other Ambulatory Visit: Payer: Self-pay | Admitting: Family Medicine

## 2017-09-02 MED ORDER — METFORMIN HCL 1000 MG PO TABS: 1000.0000 mg | ORAL_TABLET | Freq: Two times a day (BID) | ORAL | 4 refills | Status: DC

## 2017-09-02 MED ORDER — LISINOPRIL-HYDROCHLOROTHIAZIDE 10-12.5 MG PO TABS: 1.0000 | ORAL_TABLET | Freq: Every day | ORAL | 3 refills | Status: DC

## 2017-09-02 NOTE — Addendum Note (Signed)
Addended by: Lelon Huh E on: 09/02/2017 01:55 PM   Modules accepted: Orders

## 2017-11-15 ENCOUNTER — Other Ambulatory Visit: Payer: Self-pay | Admitting: Family Medicine

## 2017-11-15 MED ORDER — ZOLPIDEM TARTRATE 10 MG PO TABS: 10.0000 mg | ORAL_TABLET | Freq: Every evening | ORAL | 3 refills | Status: DC | PRN

## 2017-11-15 NOTE — Telephone Encounter (Signed)
[facility] faxed a refill request for the following medication. Thanks CC  zolpidem (AMBIEN) 10 MG tablet

## 2017-12-17 ENCOUNTER — Ambulatory Visit: Payer: Self-pay | Admitting: Family Medicine

## 2017-12-24 ENCOUNTER — Ambulatory Visit (INDEPENDENT_AMBULATORY_CARE_PROVIDER_SITE_OTHER): Payer: BLUE CROSS/BLUE SHIELD | Admitting: Family Medicine

## 2017-12-24 ENCOUNTER — Encounter: Payer: Self-pay | Admitting: Family Medicine

## 2017-12-24 VITALS — BP 110/80 | HR 77 | Temp 97.7°F | Resp 16 | Ht 63.0 in | Wt 136.0 lb

## 2017-12-24 DIAGNOSIS — E119 Type 2 diabetes mellitus without complications: Secondary | ICD-10-CM

## 2017-12-24 LAB — POCT GLYCOSYLATED HEMOGLOBIN (HGB A1C)
Est. average glucose Bld gHb Est-mCnc: 148
Hemoglobin A1C: 6.8

## 2017-12-24 MED ORDER — METFORMIN HCL 1000 MG PO TABS: 1000.0000 mg | ORAL_TABLET | Freq: Every day | ORAL | 1 refills | Status: DC

## 2017-12-24 NOTE — Progress Notes (Signed)
Patient: Barbara Harper Female    DOB: 1961/07/31   57 y.o.   MRN: [id number] Visit Date: 12/24/2017  Today's Provider: Lelon Huh, MD   Chief Complaint  Patient presents with  . Follow-up  . Diabetes  . Hypertension  . Hypothyroidism   Subjective:    HPI   Diabetes Mellitus Type II, Follow-up:   Lab Results  Component Value Date   HGBA1C 6.8 08/09/2017   HGBA1C 10.6 05/27/2017   HGBA1C 9.2 10/29/2016   Last seen for diabetes 5 months ago.  Management since then includes; no changes. She reports fair compliance with treatment. She did cut back metformin to one a day due to concerns about affect on kidneys.  She is not having side effects. none Current symptoms include none and have been unchanged. Home blood sugar records: fasting range: 120  Episodes of hypoglycemia? no   Current Insulin Regimen: n/a Most Recent Eye Exam: due-[facility] Weight trend: stable Prior visit with dietician: no Current diet: well balanced Current exercise: none  ----------------------------------------------------------------   Hypertension, follow-up:  BP Readings from Last 3 Encounters:  12/24/17 110/80  08/09/17 104/60  05/27/17 124/80    She was last seen for hypertension 1 years ago.  BP at that visit was 130/78. Management since that visit includes; no changes.She reports good compliance with treatment. She is not having side effects. none She is not exercising. She is adherent to low salt diet.   Outside blood pressures are not checking. She is experiencing none.  Patient denies none.   Cardiovascular risk factors include diabetes mellitus.  Use of agents associated with hypertension: none ----------------------------------------------------------------  Adult hypothyroidism From 05/27/2017-labs checked, no changes.  Lab Results  Component Value Date   TSH 0.42 05/27/2017        Allergies  Allergen Reactions  . Multivitamin  [Centrum]     in  20's due to trace element in MV that caused joint swelling per pt has resolved  . Penicillin G Hives     Current Outpatient Medications:  .  aspirin 81 MG tablet, Take 81 mg by mouth daily., Disp: , Rfl:  .  Blood Glucose Monitoring Suppl (ONE TOUCH ULTRA 2) w/Device KIT, Use to check sugar daily, Disp: 1 each, Rfl: 0 .  CALCIUM PO, Take by mouth., Disp: , Rfl:  .  FLUoxetine (PROZAC) 40 MG capsule, Take 1 capsule (40 mg total) by mouth daily., Disp: 90 capsule, Rfl: 4 .  gabapentin (NEURONTIN) 300 MG capsule, Take 1 capsule by mouth at bedtime., Disp: , Rfl:  .  glucose blood (ONE TOUCH ULTRA TEST) test strip, Check sugar daily, Disp: 100 each, Rfl: 4 .  IRON PO, Take by mouth., Disp: , Rfl:  .  Lancets MISC, Use Once Daily, Disp: 100 each, Rfl: 5 .  levothyroxine (SYNTHROID, LEVOTHROID) 125 MCG tablet, TAKE 1 TABLET BY MOUTH ONCE DAILY, Disp: 90 tablet, Rfl: 1 .  lisinopril-hydrochlorothiazide (PRINZIDE,ZESTORETIC) 10-12.5 MG tablet, Take 1 tablet by mouth daily., Disp: 90 tablet, Rfl: 3 .  lovastatin (MEVACOR) 40 MG tablet, Take 1 tablet (40 mg total) by mouth daily., Disp: 90 tablet, Rfl: 3 .  metFORMIN (GLUCOPHAGE) 1000 MG tablet, Take 1 tablet (1,000 mg total) by mouth 2 (two) times daily. (Patient taking differently: Take 1,000 mg by mouth daily. ), Disp: 180 tablet, Rfl: 4 .  Multiple Vitamins-Minerals (MULTIVITAMIN ADULT PO), Take 1 tablet by mouth daily., Disp: , Rfl:  .  sitaGLIPtin (JANUVIA) 100  MG tablet, Take 1 tablet (100 mg total) by mouth daily., Disp: 90 tablet, Rfl: 3 .  vitamin B-12 (CYANOCOBALAMIN) 1000 MCG tablet, Take 1 tablet by mouth daily., Disp: , Rfl:  .  zolpidem (AMBIEN) 10 MG tablet, Take 1 tablet (10 mg total) by mouth at bedtime as needed., Disp: 30 tablet, Rfl: 3  Review of Systems  Constitutional: Negative for appetite change, chills, fatigue and fever.  Respiratory: Negative for chest tightness and shortness of breath.   Cardiovascular: Negative for chest  pain and palpitations.  Gastrointestinal: Negative for abdominal pain, nausea and vomiting.  Neurological: Negative for dizziness and weakness.    Social History   Tobacco Use  . Smoking status: Never Smoker  . Smokeless tobacco: Never Used  Substance Use Topics  . Alcohol use: Yes    Alcohol/week: 0.0 oz    Comment: DRINKS WINE   Objective:   BP 110/80 (BP Location: Right Arm, Patient Position: Sitting, Cuff Size: Normal)   Pulse 77   Temp 97.7 F (36.5 C) (Oral)   Resp 16   Ht _0  (1.6 m)   Wt 136 lb (61.7 kg)   SpO2 99%   BMI 24.09 kg/m  Vitals:   12/24/17 0842  BP: 110/80  Pulse: 77  Resp: 16  Temp: 97.7 F (36.5 C)  TempSrc: Oral  SpO2: 99%  Weight: 136 lb (61.7 kg)  Height: _1  (1.6 m)     Physical Exam   General Appearance:    Alert, cooperative, no distress  Eyes:    PERRL, conjunctiva/corneas clear, EOM's intact       Lungs:     Clear to auscultation bilaterally, respirations unlabored  Heart:    Regular rate and rhythm  Neurologic:   Awake, alert, oriented x 3. No apparent focal neurological           defect.       Results for orders placed or performed in visit on 12/24/17  POCT glycosylated hemoglobin (Hb A1C)  Result Value Ref Range   Hemoglobin A1C 6.8    Est. average glucose Bld gHb Est-mCnc 148        Assessment & Plan:      1. Diabetes mellitus without complication ([place]) Well controlled.  Continue current medications.  May continue just taking one tablet of metformin daily - POCT glycosylated hemoglobin (Hb A1C) Return in September for a1c, lipids, and tsh  Advised she is due for routine gyn exam and mammogram and to contact [facility] ASAP to schedule, which she agrees to do.       Lelon Huh, MD  [facility]

## 2018-02-01 ENCOUNTER — Other Ambulatory Visit: Payer: Self-pay | Admitting: Family Medicine

## 2018-03-13 ENCOUNTER — Other Ambulatory Visit: Payer: Self-pay | Admitting: Family Medicine

## 2018-03-13 NOTE — Telephone Encounter (Signed)
Refill request for Zolpidem 10 mg tablet qd prn Last filled by MD on- 11/15/2017 #30 x3 refills Last Appt: 12/24/2017 Next Appt: 05/28/2018 Please advise refill?

## 2018-04-14 ENCOUNTER — Other Ambulatory Visit: Payer: Self-pay | Admitting: Physician Assistant

## 2018-05-12 ENCOUNTER — Other Ambulatory Visit: Payer: Self-pay | Admitting: Family Medicine

## 2018-05-28 ENCOUNTER — Ambulatory Visit (INDEPENDENT_AMBULATORY_CARE_PROVIDER_SITE_OTHER): Payer: BLUE CROSS/BLUE SHIELD | Admitting: Family Medicine

## 2018-05-28 ENCOUNTER — Encounter: Payer: Self-pay | Admitting: Family Medicine

## 2018-05-28 VITALS — BP 106/60 | HR 85 | Temp 98.6°F | Wt 139.0 lb

## 2018-05-28 DIAGNOSIS — E039 Hypothyroidism, unspecified: Secondary | ICD-10-CM | POA: Diagnosis not present

## 2018-05-28 DIAGNOSIS — Z1231 Encounter for screening mammogram for malignant neoplasm of breast: Secondary | ICD-10-CM

## 2018-05-28 DIAGNOSIS — Z124 Encounter for screening for malignant neoplasm of cervix: Secondary | ICD-10-CM | POA: Diagnosis not present

## 2018-05-28 DIAGNOSIS — I10 Essential (primary) hypertension: Secondary | ICD-10-CM

## 2018-05-28 DIAGNOSIS — E119 Type 2 diabetes mellitus without complications: Secondary | ICD-10-CM

## 2018-05-28 DIAGNOSIS — E785 Hyperlipidemia, unspecified: Secondary | ICD-10-CM

## 2018-05-28 DIAGNOSIS — Z23 Encounter for immunization: Secondary | ICD-10-CM

## 2018-05-28 DIAGNOSIS — Z1239 Encounter for other screening for malignant neoplasm of breast: Secondary | ICD-10-CM

## 2018-05-28 DIAGNOSIS — M720 Palmar fascial fibromatosis [Dupuytren]: Secondary | ICD-10-CM | POA: Insufficient documentation

## 2018-05-28 LAB — POCT GLYCOSYLATED HEMOGLOBIN (HGB A1C): Hemoglobin A1C: 7 % — AB (ref 4.0–5.6)

## 2018-05-28 NOTE — Patient Instructions (Addendum)
Please contact your eyecare professional to schedule a routine diabetic eye exam

## 2018-05-28 NOTE — Addendum Note (Signed)
Addended by: Ashley Royalty E on: 05/28/2018 01:20 PM   Modules accepted: Orders

## 2018-05-28 NOTE — Progress Notes (Signed)
Patient: Barbara Harper Female    DOB: February 19, 1961   57 y.o.   MRN: [id number] Visit Date: 05/28/2018  Today's Provider: Lelon Huh, MD   Chief Complaint  Patient presents with  . Diabetes  . Hyperlipidemia  . Hypertension  . Hypothyroidism   Subjective:    HPI   Diabetes Mellitus Type II, Follow-up:   Lab Results  Component Value Date   HGBA1C 6.8 12/24/2017   HGBA1C 6.8 08/09/2017   HGBA1C 10.6 05/27/2017   Last seen for diabetes 5 months ago.  Management since then includes None. She reports excellent compliance with treatment. She is not having side effects.  Current symptoms include none and have been stable. Home blood sugar records: Pt does not check her blood sugar often but general runs in the mid 100's.   Episodes of hypoglycemia? no   Current Insulin Regimen: None Most Recent Eye Exam: 2 years ago. Weight trend: stable Prior visit with dietician: no Current diet: in general, a "healthy" diet   Current exercise: none  ------------------------------------------------------------------------   Hypertension, follow-up:  BP Readings from Last 3 Encounters:  05/28/18 106/60  12/24/17 110/80  08/09/17 104/60    She was last seen for hypertension 5 months ago.  BP at that visit was 130/78. Management since that visit includes None .She reports excellent compliance with treatment. She is not having side effects.  She is not exercising. She is not adherent to low salt diet.   Outside blood pressures are not being checked. She is experiencing none.  Patient denies chest pain, fatigue, lower extremity edema, near-syncope and palpitations.   Cardiovascular risk factors include diabetes mellitus, dyslipidemia and hypertension.  Use of agents associated with hypertension: none.   ------------------------------------------------------------------------    Lipid/Cholesterol, Follow-up:   Last seen for this 5 months ago.  Management since  that visit includes None.  Last Lipid Panel:    Component Value Date/Time   CHOL 197 05/27/2017 0926   CHOL 175 09/30/2015 0859   TRIG 391 (H) 05/27/2017 0926   HDL 40 (L) 05/27/2017 0926   HDL 41 09/30/2015 0859   CHOLHDL 4.9 05/27/2017 0926   LDLCALC 105 (H) 05/27/2017 0926    She reports excellent compliance with treatment. She is not having side effects.   Wt Readings from Last 3 Encounters:  05/28/18 139 lb (63 kg)  12/24/17 136 lb (61.7 kg)  08/09/17 139 lb (63 kg)    ------------------------------------------------------------------------      Allergies  Allergen Reactions  . Multivitamin  [Centrum]     in 20's due to trace element in MV that caused joint swelling per pt has resolved  . Penicillin G Hives     Current Outpatient Medications:  .  aspirin 81 MG tablet, Take 81 mg by mouth daily., Disp: , Rfl:  .  Blood Glucose Monitoring Suppl (ONE TOUCH ULTRA 2) w/Device KIT, Use to check sugar daily, Disp: 1 each, Rfl: 0 .  CALCIUM PO, Take by mouth., Disp: , Rfl:  .  FLUoxetine (PROZAC) 40 MG capsule, Take 1 capsule (40 mg total) by mouth daily., Disp: 90 capsule, Rfl: 4 .  gabapentin (NEURONTIN) 300 MG capsule, Take 1 capsule by mouth at bedtime., Disp: , Rfl:  .  glucose blood (ONE TOUCH ULTRA TEST) test strip, Check sugar daily, Disp: 100 each, Rfl: 4 .  IRON PO, Take by mouth., Disp: , Rfl:  .  Lancets MISC, Use Once Daily, Disp: 100 each, Rfl: 5 .  levothyroxine (SYNTHROID, LEVOTHROID) 125 MCG tablet, TAKE 1 TABLET BY MOUTH ONCE DAILY, Disp: 90 tablet, Rfl: 1 .  lisinopril-hydrochlorothiazide (PRINZIDE,ZESTORETIC) 10-12.5 MG tablet, Take 1 tablet by mouth daily., Disp: 90 tablet, Rfl: 3 .  lovastatin (MEVACOR) 40 MG tablet, TAKE 1 TABLET BY MOUTH ONCE DAILY, Disp: 90 tablet, Rfl: 2 .  metFORMIN (GLUCOPHAGE) 1000 MG tablet, Take 1 tablet (1,000 mg total) by mouth daily., Disp: 1 tablet, Rfl: 1 .  Multiple Vitamins-Minerals (MULTIVITAMIN ADULT PO), Take 1  tablet by mouth daily., Disp: , Rfl:  .  sitaGLIPtin (JANUVIA) 100 MG tablet, Take 1 tablet (100 mg total) by mouth daily., Disp: 90 tablet, Rfl: 3 .  vitamin B-12 (CYANOCOBALAMIN) 1000 MCG tablet, Take 1 tablet by mouth daily., Disp: , Rfl:  .  zolpidem (AMBIEN) 10 MG tablet, TAKE 1 TABLET BY MOUTH AT BEDTIME AS NEEDED, Disp: 30 tablet, Rfl: 3  Review of Systems  Constitutional: Negative.   Respiratory: Negative.   Cardiovascular: Negative.   Gastrointestinal: Negative.   Endocrine: Negative.   Musculoskeletal: Negative.   Skin: Positive for rash (On left toes. Comes and goes.). Negative for color change, pallor and wound.  Neurological: Negative for dizziness, light-headedness and headaches.    Social History   Tobacco Use  . Smoking status: Never Smoker  . Smokeless tobacco: Never Used  Substance Use Topics  . Alcohol use: Yes    Alcohol/week: 0.0 standard drinks    Comment: DRINKS WINE   Objective:   BP 106/60 (BP Location: Right Arm, Patient Position: Sitting, Cuff Size: Normal)   Pulse 85   Temp 98.6 F (37 C) (Oral)   Wt 139 lb (63 kg)   SpO2 97%   BMI 24.62 kg/m  Vitals:   05/28/18 0837  BP: 106/60  Pulse: 85  Temp: 98.6 F (37 C)  TempSrc: Oral  SpO2: 97%  Weight: 139 lb (63 kg)     Physical Exam   General Appearance:    Alert, cooperative, no distress  Eyes:    PERRL, conjunctiva/corneas clear, EOM's intact       Lungs:     Clear to auscultation bilaterally, respirations unlabored  Heart:    Regular rate and rhythm  Neurologic:   Awake, alert, oriented x 3. No apparent focal neurological           defect.       A1c=7.0%     Assessment & Plan:     1. Essential hypertension Well controlled.  Continue current medications.    2. Type 2 diabetes mellitus without complication, without long-term current use of insulin (HCC) Stable. Work on cutting back on snacks in diet.   3. Hyperlipidemia, unspecified hyperlipidemia type She is tolerating  lovastatin well with no adverse effects.   - Comprehensive metabolic panel - Lipid panel  4. Adult hypothyroidism  - TSH  5. Cervical cancer screening Previously followed by Dr. Ammie Dalton.  - Ambulatory referral to Gynecology  6. Breast cancer screening  - Ambulatory referral to Gynecology  7. Need for shingles vaccine  - Varicella-zoster vaccine IM (Shingrix)  Return in about 6 months (around 11/26/2018) for diabetes. 2 months for Shingrix #2.       Lelon Huh, MD  [facility]

## 2018-05-29 ENCOUNTER — Encounter: Payer: Self-pay | Admitting: Family Medicine

## 2018-05-29 ENCOUNTER — Telehealth: Payer: Self-pay

## 2018-05-29 DIAGNOSIS — E039 Hypothyroidism, unspecified: Secondary | ICD-10-CM

## 2018-05-29 LAB — TSH: TSH: 0.1 u[IU]/mL — ABNORMAL LOW (ref 0.450–4.500)

## 2018-05-29 LAB — COMPREHENSIVE METABOLIC PANEL
ALT: 25 IU/L (ref 0–32)
AST: 16 IU/L (ref 0–40)
Albumin/Globulin Ratio: 1.7 (ref 1.2–2.2)
Albumin: 4.3 g/dL (ref 3.5–5.5)
Alkaline Phosphatase: 76 IU/L (ref 39–117)
BUN/Creatinine Ratio: 15 (ref 9–23)
BUN: 11 mg/dL (ref 6–24)
Bilirubin Total: 0.4 mg/dL (ref 0.0–1.2)
CO2: 22 mmol/L (ref 20–29)
Calcium: 9.9 mg/dL (ref 8.7–10.2)
Chloride: 97 mmol/L (ref 96–106)
Creatinine, Ser: 0.73 mg/dL (ref 0.57–1.00)
GFR calc Af Amer: 105 mL/min/{1.73_m2} (ref >59)
GFR calc non Af Amer: 91 mL/min/{1.73_m2} (ref >59)
Globulin, Total: 2.5 g/dL (ref 1.5–4.5)
Glucose: 146 mg/dL — ABNORMAL HIGH (ref 65–99)
Potassium: 4.5 mmol/L (ref 3.5–5.2)
Sodium: 138 mmol/L (ref 134–144)
Total Protein: 6.8 g/dL (ref 6.0–8.5)

## 2018-05-29 LAB — LIPID PANEL
Chol/HDL Ratio: 3.9 ratio (ref 0.0–4.4)
Cholesterol, Total: 175 mg/dL (ref 100–199)
HDL: 45 mg/dL (ref >39)
LDL Calculated: 77 mg/dL (ref 0–99)
Triglycerides: 263 mg/dL — ABNORMAL HIGH (ref 0–149)
VLDL Cholesterol Cal: 53 mg/dL — ABNORMAL HIGH (ref 5–40)

## 2018-05-29 MED ORDER — LEVOTHYROXINE SODIUM 112 MCG PO TABS: 112.0000 ug | ORAL_TABLET | Freq: Every day | ORAL | 2 refills | Status: DC

## 2018-05-29 NOTE — Telephone Encounter (Signed)
-----   Message from [name], MD sent at 05/29/2018  7:58 AM EDT ----- Is hypERthyroid. Need to reduce levothyroxine to 112 mcg daily, #90, rf x 2. Cholesterol is well controlled at 175. Continue other medications unchanged. Schedule follow up diabetes and thyroid 6 months.

## 2018-05-29 NOTE — Telephone Encounter (Signed)
Patient advised and agrees with treatment plan. Follow up appointment scheduled 11/27/2017 at 9:40am. Prescription sent into the pharmacy.

## 2018-06-10 ENCOUNTER — Ambulatory Visit (INDEPENDENT_AMBULATORY_CARE_PROVIDER_SITE_OTHER): Payer: BLUE CROSS/BLUE SHIELD | Admitting: Obstetrics and Gynecology

## 2018-06-10 ENCOUNTER — Other Ambulatory Visit (HOSPITAL_COMMUNITY)
Admission: RE | Admit: 2018-06-10 | Discharge: 2018-06-10 | Disposition: A | Discharge status: Home / Self Care | Payer: BLUE CROSS/BLUE SHIELD | Source: Ambulatory Visit | Attending: Obstetrics and Gynecology | Admitting: Obstetrics and Gynecology

## 2018-06-10 ENCOUNTER — Encounter: Payer: Self-pay | Admitting: Obstetrics and Gynecology

## 2018-06-10 VITALS — BP 132/70 | HR 81 | Ht 62.0 in | Wt 142.0 lb

## 2018-06-10 DIAGNOSIS — Z1231 Encounter for screening mammogram for malignant neoplasm of breast: Secondary | ICD-10-CM | POA: Diagnosis not present

## 2018-06-10 DIAGNOSIS — N926 Irregular menstruation, unspecified: Secondary | ICD-10-CM | POA: Diagnosis not present

## 2018-06-10 DIAGNOSIS — Z01411 Encounter for gynecological examination (general) (routine) with abnormal findings: Secondary | ICD-10-CM

## 2018-06-10 DIAGNOSIS — Z124 Encounter for screening for malignant neoplasm of cervix: Secondary | ICD-10-CM

## 2018-06-10 DIAGNOSIS — Z1151 Encounter for screening for human papillomavirus (HPV): Secondary | ICD-10-CM | POA: Insufficient documentation

## 2018-06-10 DIAGNOSIS — Z1239 Encounter for other screening for malignant neoplasm of breast: Secondary | ICD-10-CM

## 2018-06-10 DIAGNOSIS — Z01419 Encounter for gynecological examination (general) (routine) without abnormal findings: Secondary | ICD-10-CM

## 2018-06-10 NOTE — Patient Instructions (Signed)
I value your feedback and entrusting us with your care. If you get a [facility] patient survey, I would appreciate you taking the time to let us know about your experience today. Thank you!  [facility]: [phone]  [facility]: [phone]  

## 2018-06-10 NOTE — Progress Notes (Signed)
PCP: [name], MD   Chief Complaint  Patient presents with  . Gynecologic Exam    [facility] referring for cervical cancer screening, pt says here for annual, PCP did blood work only    HPI:      Ms. Barbara Harper is a 57 y.o. [phone] who LMP was No LMP recorded., presents today for her NP>3 yrs annual examination.  Her menses are every 4-5 months, lasting 2-3 days, light flow.  Dysmenorrhea mild. She does not have intermenstrual bleeding. She does have tolerable vasomotor sx. Has never gone a year without bleeding.   Sex activity: not sexually active currently; boyfriend diagnosed with prostate cancer this yr and undergoing tx. She does not have vaginal dryness.  Last Pap: November 04, 2013  Results were: no abnormalities /POS HPV DNA. Never had repeat pap. Hx of STDs: HPV  Last mammogram: November 04, 2013  Results were: normal--routine follow-up in 12 months There is no FH of breast cancer. There is no FH of ovarian cancer. The patient does not do self-breast exams.  Colonoscopy: colonoscopy ~ 3 years ago with  polyp. Repeat due after ? Years--pt unsure. Followed by PCP  Tobacco use: The patient denies current or previous tobacco use. Alcohol use: social drinker Exercise: moderately active  She does get adequate calcium and Vitamin D in her diet.  Labs with PCP.   Past Medical History:  Diagnosis Date  . Diabetes mellitus without complication ([place])   . Hyperlipidemia   . Restless leg syndrome   . Thyroid disease     Past Surgical History:  Procedure Laterality Date  . CESAREAN SECTION     x3  . Excision of subcutaneous mass  07/24/2004   left anterior lateral chest wall. Dr. Bary Castilla  . GASTRIC BYPASS  2008  . TUBAL LIGATION  1999    Family History  Problem Relation Age of Onset  . Diabetes Mother        type 2  . Diabetes Other        type 2    Social History   Socioeconomic History  . Marital status: Widowed    Spouse name: Not on file  . Number  of children: 3  . Years of education: Not on file  . Highest education level: Not on file  Occupational History  . Occupation: Works at [employer]  . Financial resource strain: Not on file  . Food insecurity:    Worry: Not on file    Inability: Not on file  . Transportation needs:    Medical: Not on file    Non-medical: Not on file  Tobacco Use  . Smoking status: Never Smoker  . Smokeless tobacco: Never Used  Substance and Sexual Activity  . Alcohol use: Yes    Alcohol/week: 0.0 standard drinks    Comment: DRINKS WINE  . Drug use: No  . Sexual activity: Not Currently  Lifestyle  . Physical activity:    Days per week: Not on file    Minutes per session: Not on file  . Stress: Not on file  Relationships  . Social connections:    Talks on phone: Not on file    Gets together: Not on file    Attends religious service: Not on file    Active member of club or organization: Not on file    Attends meetings of clubs or organizations: Not on file    Relationship status: Not on file  .  Intimate partner violence:    Fear of current or ex partner: Not on file    Emotionally abused: Not on file    Physically abused: Not on file    Forced sexual activity: Not on file  Other Topics Concern  . Not on file  Social History Narrative  . Not on file    Outpatient Medications Prior to Visit  Medication Sig Dispense Refill  . aspirin 81 MG tablet Take 81 mg by mouth daily.    . Blood Glucose Monitoring Suppl (ONE TOUCH ULTRA 2) w/Device KIT Use to check sugar daily 1 each 0  . CALCIUM PO Take by mouth.    Marland Kitchen FLUoxetine (PROZAC) 40 MG capsule Take 1 capsule (40 mg total) by mouth daily. 90 capsule 4  . gabapentin (NEURONTIN) 300 MG capsule Take 1 capsule by mouth at bedtime.    Marland Kitchen glucose blood (ONE TOUCH ULTRA TEST) test strip Check sugar daily 100 each 4  . IRON PO Take by mouth.    . Lancets MISC Use Once Daily 100 each 5  . levothyroxine (SYNTHROID,  LEVOTHROID) 112 MCG tablet Take 1 tablet (112 mcg total) by mouth daily. 90 tablet 2  . lisinopril-hydrochlorothiazide (PRINZIDE,ZESTORETIC) 10-12.5 MG tablet Take 1 tablet by mouth daily. 90 tablet 3  . lovastatin (MEVACOR) 40 MG tablet TAKE 1 TABLET BY MOUTH ONCE DAILY 90 tablet 2  . metFORMIN (GLUCOPHAGE) 1000 MG tablet Take 1 tablet (1,000 mg total) by mouth daily. 1 tablet 1  . Multiple Vitamins-Minerals (MULTIVITAMIN ADULT PO) Take 1 tablet by mouth daily.    . sitaGLIPtin (JANUVIA) 100 MG tablet Take 1 tablet (100 mg total) by mouth daily. 90 tablet 3  . zolpidem (AMBIEN) 10 MG tablet TAKE 1 TABLET BY MOUTH AT BEDTIME AS NEEDED 30 tablet 3  . vitamin B-12 (CYANOCOBALAMIN) 1000 MCG tablet Take 1 tablet by mouth daily.     No facility-administered medications prior to visit.        ROS:  Review of Systems  Constitutional: Negative for fatigue, fever and unexpected weight change.  Respiratory: Negative for cough, shortness of breath and wheezing.   Cardiovascular: Negative for chest pain, palpitations and leg swelling.  Gastrointestinal: Negative for blood in stool, constipation, diarrhea, nausea and vomiting.  Endocrine: Negative for cold intolerance, heat intolerance and polyuria.  Genitourinary: Negative for dyspareunia, dysuria, flank pain, frequency, genital sores, hematuria, menstrual problem, pelvic pain, urgency, vaginal bleeding, vaginal discharge and vaginal pain.  Musculoskeletal: Negative for back pain, joint swelling and myalgias.  Skin: Negative for rash.  Neurological: Negative for dizziness, syncope, light-headedness, numbness and headaches.  Hematological: Negative for adenopathy.  Psychiatric/Behavioral: Negative for agitation, confusion, sleep disturbance and suicidal ideas. The patient is not nervous/anxious.   BREAST: No symptoms    Objective: BP 132/70   Pulse 81   Ht _0  (1.575 m)   Wt 142 lb (64.4 kg)   BMI 25.97 kg/m    Physical Exam    Constitutional: She is oriented to person, place, and time. She appears well-developed and well-nourished.  Genitourinary: Vagina normal and uterus normal. There is no rash or tenderness on the right labia. There is no rash or tenderness on the left labia. No erythema or tenderness in the vagina. No vaginal discharge found. Right adnexum does not display mass and does not display tenderness. Left adnexum does not display mass and does not display tenderness. Cervix does not exhibit motion tenderness or polyp. Uterus is not enlarged or tender.  Neck: Normal range of motion. No thyromegaly present.  Cardiovascular: Normal rate, regular rhythm and normal heart sounds.  No murmur heard. Pulmonary/Chest: Effort normal and breath sounds normal. Right breast exhibits no mass, no nipple discharge, no skin change and no tenderness. Left breast exhibits no mass, no nipple discharge, no skin change and no tenderness.  Abdominal: Soft. There is no tenderness. There is no guarding.  Musculoskeletal: Normal range of motion.  Neurological: She is alert and oriented to person, place, and time. No cranial nerve deficit.  Psychiatric: She has a normal mood and affect. Her behavior is normal.  Vitals reviewed.   Assessment/Plan:  Encounter for annual routine gynecological examination  Cervical cancer screening - Plan: Cytology - PAP  Screening for HPV (human papillomavirus) - Plan: Cytology - PAP  Screening for breast cancer - Pt to sched mammo. - Plan: MM 3D SCREEN BREAST BILATERAL  Irregular bleeding - Never been a yr without bleeding, but given age will check GYN u/s to rule out path. Will call with results.  - Plan: US PELVIS TRANSVANGINAL NON-OB (TV ONLY)          GYN counsel breast self exam, mammography screening, menopause, adequate intake of calcium and vitamin D, diet and exercise    F/U  Return in about 1 day (around 06/11/2018) for GYN u/s for bleeding--[facility] to call pt.  Ohm Dentler B. Camika Marsico,  PA-C 06/10/2018 2:17 PM

## 2018-06-11 ENCOUNTER — Ambulatory Visit (INDEPENDENT_AMBULATORY_CARE_PROVIDER_SITE_OTHER): Payer: BLUE CROSS/BLUE SHIELD

## 2018-06-11 DIAGNOSIS — N939 Abnormal uterine and vaginal bleeding, unspecified: Secondary | ICD-10-CM

## 2018-06-11 DIAGNOSIS — N926 Irregular menstruation, unspecified: Secondary | ICD-10-CM

## 2018-06-12 LAB — CYTOLOGY - PAP
Diagnosis: NEGATIVE
HPV: NOT DETECTED

## 2018-07-29 ENCOUNTER — Ambulatory Visit (INDEPENDENT_AMBULATORY_CARE_PROVIDER_SITE_OTHER): Payer: BLUE CROSS/BLUE SHIELD | Admitting: Family Medicine

## 2018-07-29 DIAGNOSIS — Z23 Encounter for immunization: Secondary | ICD-10-CM

## 2018-08-07 ENCOUNTER — Other Ambulatory Visit: Payer: Self-pay | Admitting: Family Medicine

## 2018-09-05 ENCOUNTER — Other Ambulatory Visit: Payer: Self-pay | Admitting: Family Medicine

## 2018-09-05 DIAGNOSIS — E119 Type 2 diabetes mellitus without complications: Secondary | ICD-10-CM

## 2018-11-28 ENCOUNTER — Other Ambulatory Visit: Payer: Self-pay

## 2018-11-28 ENCOUNTER — Encounter: Payer: Self-pay | Admitting: Family Medicine

## 2018-11-28 ENCOUNTER — Ambulatory Visit (INDEPENDENT_AMBULATORY_CARE_PROVIDER_SITE_OTHER): Payer: BLUE CROSS/BLUE SHIELD | Admitting: Family Medicine

## 2018-11-28 VITALS — BP 122/64 | HR 87 | Temp 98.2°F | Ht 63.0 in | Wt 144.0 lb

## 2018-11-28 DIAGNOSIS — E785 Hyperlipidemia, unspecified: Secondary | ICD-10-CM | POA: Diagnosis not present

## 2018-11-28 DIAGNOSIS — E1169 Type 2 diabetes mellitus with other specified complication: Secondary | ICD-10-CM | POA: Diagnosis not present

## 2018-11-28 DIAGNOSIS — E039 Hypothyroidism, unspecified: Secondary | ICD-10-CM | POA: Diagnosis not present

## 2018-11-28 LAB — POCT GLYCOSYLATED HEMOGLOBIN (HGB A1C): Hemoglobin A1C: 7.8 % — AB (ref 4.0–5.6)

## 2018-11-28 MED ORDER — SITAGLIPTIN PHOSPHATE 100 MG PO TABS: 100.0000 mg | ORAL_TABLET | Freq: Every day | ORAL | 1 refills | Status: DC

## 2018-11-28 MED ORDER — METFORMIN HCL 1000 MG PO TABS: 1000.0000 mg | ORAL_TABLET | Freq: Two times a day (BID) | ORAL | 3 refills | Status: DC

## 2018-11-28 MED ORDER — LISINOPRIL-HYDROCHLOROTHIAZIDE 10-12.5 MG PO TABS: 1.0000 | ORAL_TABLET | Freq: Every day | ORAL | 4 refills | Status: DC

## 2018-11-28 MED ORDER — SEMAGLUTIDE(0.25 OR 0.5MG/DOS) 2 MG/1.5ML ~~LOC~~ SOPN: 0.5000 mg | PEN_INJECTOR | SUBCUTANEOUS | 3 refills | Status: DC

## 2018-11-28 MED ORDER — FLUOXETINE HCL 40 MG PO CAPS: 40.0000 mg | ORAL_CAPSULE | Freq: Every day | ORAL | 4 refills | Status: DC

## 2018-11-28 NOTE — Progress Notes (Signed)
Patient: Barbara Harper Female    DOB: Aug 18, 1961   58 y.o.   MRN: [id number] Visit Date: 11/28/2018  Today's Provider: Lelon Huh, MD   Chief Complaint  Patient presents with  . Diabetes    6 month fup  . Hypothyroidism    6 month fup   Subjective:     HPI  Diabetes Mellitus Type II, Follow-up:   Lab Results  Component Value Date   HGBA1C 7.0 (A) 05/28/2018   HGBA1C 6.8 12/24/2017   HGBA1C 6.8 08/09/2017    Last seen for diabetes 6 months ago.  Management since then includes cutting back on snacks in diet. She reports good compliance with treatment. She is not having side effects.  Current symptoms include none and have been stable. Home blood sugar records: fasting range: pt not checking blood sugars at home  Episodes of hypoglycemia? no   Current Insulin Regimen: none Most Recent Eye Exam: 2 yrs ago Weight trend: stable Prior visit with dietician: No Current exercise: bicycling on exercise bike Current diet habits: in general, a "healthy" diet    Wt Readings from Last 3 Encounters:  11/28/18 144 lb (65.3 kg)  06/10/18 142 lb (64.4 kg)  05/28/18 139 lb (63 kg)      Hypothyroid, follow-up:  TSH  Date Value Ref Range Status  05/28/2018 0.100 (L) 0.450 - 4.500 uIU/mL Final  05/27/2017 0.42 0.40 - 4.50 mIU/L Final  03/02/2016 0.157 (L) 0.450 - 4.500 uIU/mL Final   Wt Readings from Last 3 Encounters:  11/28/18 144 lb (65.3 kg)  06/10/18 142 lb (64.4 kg)  05/28/18 139 lb (63 kg)    She was last seen for hypothyroid 6 months ago.  Management since that visit includes reduced levothyroxine to 112 mcg daily. She reports fair compliance with treatment. She is having side effects pt feels like she doesn't have as much energy.  She is exercising. She is experiencing change in energy level She denies none Weight trend: stable  ------------------------------------------------------------------------   Pertinent Labs:    Component Value  Date/Time   CHOL 175 05/28/2018 0926   TRIG 263 (H) 05/28/2018 0926   HDL 45 05/28/2018 0926   LDLCALC 77 05/28/2018 0926   LDLCALC 105 (H) 05/27/2017 0926   CREATININE 0.73 05/28/2018 0926   CREATININE 0.79 05/27/2017 0926    Wt Readings from Last 3 Encounters:  11/28/18 144 lb (65.3 kg)  06/10/18 142 lb (64.4 kg)  05/28/18 139 lb (63 kg)    ------------------------------------------------------------------------  Allergies  Allergen Reactions  . Multivitamin  [Centrum]     in 20's due to trace element in MV that caused joint swelling per pt has resolved  . Penicillin G Hives     Current Outpatient Medications:  .  aspirin 81 MG tablet, Take 81 mg by mouth daily., Disp: , Rfl:  .  Blood Glucose Monitoring Suppl (ONE TOUCH ULTRA 2) w/Device KIT, Use to check sugar daily, Disp: 1 each, Rfl: 0 .  CALCIUM PO, Take by mouth., Disp: , Rfl:  .  FLUoxetine (PROZAC) 40 MG capsule, TAKE 1 CAPSULE BY MOUTH ONCE DAILY, Disp: 90 capsule, Rfl: 0 .  gabapentin (NEURONTIN) 300 MG capsule, Take 1 capsule by mouth at bedtime., Disp: , Rfl:  .  glucose blood (ONE TOUCH ULTRA TEST) test strip, Check sugar daily, Disp: 100 each, Rfl: 4 .  IRON PO, Take by mouth., Disp: , Rfl:  .  JANUVIA 100 MG tablet, TAKE 1  TABLET BY MOUTH ONCE DAILY, Disp: 90 tablet, Rfl: 0 .  Lancets MISC, Use Once Daily, Disp: 100 each, Rfl: 5 .  levothyroxine (SYNTHROID, LEVOTHROID) 112 MCG tablet, Take 1 tablet (112 mcg total) by mouth daily., Disp: 90 tablet, Rfl: 2 .  lisinopril-hydrochlorothiazide (PRINZIDE,ZESTORETIC) 10-12.5 MG tablet, TAKE 1 TABLET BY MOUTH ONCE DAILY, Disp: 90 tablet, Rfl: 0 .  lovastatin (MEVACOR) 40 MG tablet, TAKE 1 TABLET BY MOUTH ONCE DAILY, Disp: 90 tablet, Rfl: 2 .  metFORMIN (GLUCOPHAGE) 1000 MG tablet, Take 1 tablet (1,000 mg total) by mouth daily. (Patient taking differently: Take 1,000 mg by mouth 2 (two) times daily with a meal. ), Disp: 1 tablet, Rfl: 1 .  Multiple Vitamins-Minerals  (MULTIVITAMIN ADULT PO), Take 1 tablet by mouth daily., Disp: , Rfl:  .  vitamin B-12 (CYANOCOBALAMIN) 1000 MCG tablet, Take 1 tablet by mouth daily., Disp: , Rfl:  .  zolpidem (AMBIEN) 10 MG tablet, TAKE 1 TABLET BY MOUTH AT BEDTIME AS NEEDED, Disp: 30 tablet, Rfl: 0  Review of Systems  Constitutional: Negative.   HENT: Negative.   Eyes: Negative.   Respiratory: Negative.   Cardiovascular: Negative.   Gastrointestinal: Negative.   Endocrine: Negative.   Genitourinary: Negative.   Musculoskeletal: Negative.   Skin: Negative.   Allergic/Immunologic: Negative.   Neurological: Negative.   Hematological: Negative.   Psychiatric/Behavioral: Negative.     Social History   Tobacco Use  . Smoking status: Never Smoker  . Smokeless tobacco: Never Used  Substance Use Topics  . Alcohol use: Yes    Alcohol/week: 0.0 standard drinks    Comment: DRINKS WINE      Objective:   BP 122/64 (BP Location: Right Arm, Patient Position: Sitting, Cuff Size: Normal)   Pulse 87   Temp 98.2 F (36.8 C) (Oral)   Ht _0  (1.6 m)   Wt 144 lb (65.3 kg)   SpO2 97%   BMI 25.51 kg/m  Vitals:   11/28/18 1007  BP: 122/64  Pulse: 87  Temp: 98.2 F (36.8 C)  TempSrc: Oral  SpO2: 97%  Weight: 144 lb (65.3 kg)  Height: _1  (1.6 m)     Physical Exam   General Appearance:    Alert, cooperative, no distress  Eyes:    PERRL, conjunctiva/corneas clear, EOM's intact       Lungs:     Clear to auscultation bilaterally, respirations unlabored  Heart:    Regular rate and rhythm  Neurologic:   Awake, alert, oriented x 3. No apparent focal neurological           defect.       Results for orders placed or performed in visit on 11/28/18  POCT glycosylated hemoglobin (Hb A1C)  Result Value Ref Range   Hemoglobin A1C 7.8 (A) 4.0 - 5.6 %   HbA1c POC (<> result, manual entry)     HbA1c, POC (prediabetic range)     HbA1c, POC (controlled diabetic range)         Assessment & Plan    1. Type 2  diabetes mellitus without complication, with hyperlipidemia ([place]) Discussed options of improving diet and increasing exercising or changing medications. She states Januvia is still very expensive and she wouldn't mind changing to a different medications if it works better. Given printed prescription for Ozempic. She is going to check with her insurance to see if it covered and will change from Januvia if so. Also advised or pharmaceutical program for oral semaglutide if injectable  Ozempic is not covered.   - POCT glycosylated hemoglobin (Hb A1C)  She is tolerating lovastatin well with no adverse effects.    2. Adult hypothyroidism  - TSH  3. Hypertension Well controlled.  Continue current medications.      Lelon Huh, MD  [facility]

## 2018-11-28 NOTE — Patient Instructions (Addendum)
.   Please review the attached list of medications and notify my office if there are any errors.   . Check with your insurance or pharmacy to see if Ozempic is covered. If so then take it once a week in place of Januvia. If not then continue Januvia

## 2018-11-29 LAB — TSH: TSH: 0.037 u[IU]/mL — ABNORMAL LOW (ref 0.450–4.500)

## 2018-12-01 ENCOUNTER — Telehealth: Payer: Self-pay

## 2018-12-01 NOTE — Telephone Encounter (Signed)
Tried calling patient. Left message to call back. 

## 2018-12-01 NOTE — Telephone Encounter (Signed)
Patient returned call and was advised.  New Rx needs to be sent in. Thanks

## 2018-12-01 NOTE — Telephone Encounter (Signed)
-----   Message from [name], MD sent at 11/29/2018  8:58 PM EDT ----- Is hypothyroid, need to reduce levothyroxine to 153mcg once tablet  Daily, #90, rf x 1

## 2018-12-02 MED ORDER — LEVOTHYROXINE SODIUM 100 MCG PO TABS: 100.0000 ug | ORAL_TABLET | Freq: Every day | ORAL | 1 refills | Status: DC

## 2018-12-02 NOTE — Telephone Encounter (Signed)
RX sent to [facility] [address]   Thanks,   -Usher Hedberg  

## 2018-12-27 ENCOUNTER — Other Ambulatory Visit: Payer: Self-pay | Admitting: Family Medicine

## 2019-03-02 ENCOUNTER — Ambulatory Visit (INDEPENDENT_AMBULATORY_CARE_PROVIDER_SITE_OTHER): Payer: BC Managed Care – PPO | Admitting: Family Medicine

## 2019-03-02 ENCOUNTER — Other Ambulatory Visit: Payer: Self-pay

## 2019-03-02 ENCOUNTER — Encounter: Payer: Self-pay | Admitting: Family Medicine

## 2019-03-02 VITALS — BP 108/64 | HR 89 | Temp 98.4°F | Resp 16 | Wt 133.0 lb

## 2019-03-02 DIAGNOSIS — I10 Essential (primary) hypertension: Secondary | ICD-10-CM | POA: Diagnosis not present

## 2019-03-02 DIAGNOSIS — E039 Hypothyroidism, unspecified: Secondary | ICD-10-CM | POA: Diagnosis not present

## 2019-03-02 DIAGNOSIS — E785 Hyperlipidemia, unspecified: Secondary | ICD-10-CM

## 2019-03-02 DIAGNOSIS — E1169 Type 2 diabetes mellitus with other specified complication: Secondary | ICD-10-CM | POA: Diagnosis not present

## 2019-03-02 DIAGNOSIS — Z1159 Encounter for screening for other viral diseases: Secondary | ICD-10-CM | POA: Diagnosis not present

## 2019-03-02 NOTE — Progress Notes (Signed)
Patient: Barbara Harper Female    DOB: Dec 02, 1960   58 y.o.   MRN: [id number] Visit Date: 03/02/2019  Today's Provider: Lelon Huh, MD   No chief complaint on file.  Subjective:     HPI  Diabetes Mellitus Type II, Follow-up:   Lab Results  Component Value Date   HGBA1C 7.8 (A) 11/28/2018   HGBA1C 7.0 (A) 05/28/2018   HGBA1C 6.8 12/24/2017   Last seen for diabetes 3 months ago.  Management since then includes stopping Januvia and starting Ozempic. Is now taking 0.25m each week and tolerating without adverse effects, but still seeing some sugars into the 300s.  She reports good compliance with treatment. She is not having side effects.  Current symptoms include none and have been stable. Home blood sugar records: patient unsure of home blood sugar average  Episodes of hypoglycemia? no   Current Insulin Regimen: Ozempic Most Recent Eye Exam: 1 year ago Weight trend: decreasing steadily Prior visit with dietician: no Current diet: well balanced Current exercise: none  ------------------------------------------------------------------------   Hypertension, follow-up:  BP Readings from Last 3 Encounters:  03/02/19 108/64  11/28/18 122/64  06/10/18 132/70    She was last seen for hypertension 3 months ago.  BP at that visit was 122/64. Management since that visit includes no changes.She reports good compliance with treatment. She is not having side effects.  She is not exercising. She is not adherent to low salt diet.   Outside blood pressures are not being checked. She is experiencing none.  Patient denies chest pain, chest pressure/discomfort, claudication, dyspnea, exertional chest pressure/discomfort, fatigue, irregular heart beat, lower extremity edema, near-syncope, orthopnea, palpitations, paroxysmal nocturnal dyspnea, syncope and tachypnea.   Cardiovascular risk factors include diabetes mellitus, dyslipidemia and hypertension.  Use of agents  associated with hypertension: NSAIDS.   ------------------------------------------------------------------------    Lipid/Cholesterol, Follow-up:   Last seen for this 3 months ago.  Management since that visit includes none.  Last Lipid Panel:    Component Value Date/Time   CHOL 175 05/28/2018 0926   TRIG 263 (H) 05/28/2018 0926   HDL 45 05/28/2018 0926   CHOLHDL 3.9 05/28/2018 0926   CHOLHDL 4.9 05/27/2017 0926   LDLCALC 77 05/28/2018 0926   LDLCALC 105 (H) 05/27/2017 0926    She reports good compliance with treatment. She is not having side effects.   Wt Readings from Last 3 Encounters:  03/02/19 133 lb (60.3 kg)  11/28/18 144 lb (65.3 kg)  06/10/18 142 lb (64.4 kg)    ------------------------------------------------------------------------  Follow up for Hypothyroidism:  The patient was last seen for this 3 months ago. Changes made at last visit include reducing Levothyroxine to 1091m daily. She reports good compliance with treatment. She feels that condition is Unchanged. She is not having side effects.   ------------------------------------------------------------------------------------  Allergies  Allergen Reactions  . Multivitamin  [Centrum]     in 20's due to trace element in MV that caused joint swelling per pt has resolved  . Penicillin G Hives     Current Outpatient Medications:  .  aspirin 81 MG tablet, Take 81 mg by mouth daily., Disp: , Rfl:  .  Blood Glucose Monitoring Suppl (ONE TOUCH ULTRA 2) w/Device KIT, Use to check sugar daily, Disp: 1 each, Rfl: 0 .  CALCIUM PO, Take by mouth., Disp: , Rfl:  .  FLUoxetine (PROZAC) 40 MG capsule, Take 1 capsule (40 mg total) by mouth daily., Disp: 90 capsule, Rfl: 4 .  gabapentin (NEURONTIN) 300 MG capsule, Take 1 capsule by mouth at bedtime., Disp: , Rfl:  .  glucose blood (ONE TOUCH ULTRA TEST) test strip, Check sugar daily, Disp: 100 each, Rfl: 4 .  IRON PO, Take by mouth., Disp: , Rfl:  .  Lancets  MISC, Use Once Daily, Disp: 100 each, Rfl: 5 .  levothyroxine (SYNTHROID, LEVOTHROID) 100 MCG tablet, Take 1 tablet (100 mcg total) by mouth daily., Disp: 90 tablet, Rfl: 1 .  lisinopril-hydrochlorothiazide (PRINZIDE,ZESTORETIC) 10-12.5 MG tablet, Take 1 tablet by mouth daily., Disp: 90 tablet, Rfl: 4 .  lovastatin (MEVACOR) 40 MG tablet, TAKE 1 TABLET BY MOUTH ONCE DAILY, Disp: 90 tablet, Rfl: 2 .  metFORMIN (GLUCOPHAGE) 1000 MG tablet, Take 1 tablet (1,000 mg total) by mouth 2 (two) times daily with a meal., Disp: 180 tablet, Rfl: 3 .  Multiple Vitamins-Minerals (MULTIVITAMIN ADULT PO), Take 1 tablet by mouth daily., Disp: , Rfl:  .  Semaglutide,0.25 or 0.5MG/DOS, (OZEMPIC, 0.25 OR 0.5 MG/DOSE,) 2 MG/1.5ML SOPN, Inject 0.5 mg into the skin once a week., Disp: 2 pen, Rfl: 3 .  vitamin B-12 (CYANOCOBALAMIN) 1000 MCG tablet, Take 1 tablet by mouth daily., Disp: , Rfl:  .  zolpidem (AMBIEN) 10 MG tablet, TAKE 1 TABLET BY MOUTH AT BEDTIME AS NEEDED, Disp: 30 tablet, Rfl: 5 .  sitaGLIPtin (JANUVIA) 100 MG tablet, Take 1 tablet (100 mg total) by mouth daily. (Patient not taking: Reported on 03/02/2019), Disp: 90 tablet, Rfl: 1  Review of Systems  Constitutional: Negative for appetite change, chills, fatigue and fever.  Respiratory: Negative for chest tightness and shortness of breath.   Cardiovascular: Negative for chest pain and palpitations.  Gastrointestinal: Negative for abdominal pain, nausea and vomiting.  Neurological: Negative for dizziness and weakness.    Social History   Tobacco Use  . Smoking status: Never Smoker  . Smokeless tobacco: Never Used  Substance Use Topics  . Alcohol use: Yes    Alcohol/week: 0.0 standard drinks    Comment: DRINKS WINE      Objective:   BP 108/64 (BP Location: Left Arm, Patient Position: Sitting, Cuff Size: Normal)   Pulse 89   Temp 98.4 F (36.9 C) (Oral)   Resp 16   Wt 133 lb (60.3 kg)   SpO2 97% Comment: room air  BMI 23.56 kg/m  Vitals:    03/02/19 0902  BP: 108/64  Pulse: 89  Resp: 16  Temp: 98.4 F (36.9 C)  TempSrc: Oral  SpO2: 97%  Weight: 133 lb (60.3 kg)     Physical Exam  General appearance: alert, well developed, well nourished, cooperative and in no distress Head: Normocephalic, without obvious abnormality, atraumatic Respiratory: Respirations even and unlabored, normal respiratory rate Extremities: No gross deformities Skin: Skin color, texture, turgor normal. No rashes seen  Psych: Appropriate mood and affect. Neurologic: Mental status: Alert, oriented to person, place, and time, thought content appropriate.     Assessment & Plan    1. Type 2 diabetes mellitus with hyperlipidemia ([facility]) Is tolerating change from Januvia to Ozempic well. Sugars are still high on the 0.54m dose. See how a1c looks anticipate increasing to 156m- Hemoglobin A1c  2. Essential hypertension Well controlled.  Continue current medications.    3. Need for hepatitis C screening test  - Hepatitis C antibody  4. Hyperlipidemia, unspecified hyperlipidemia type She is tolerating lovastatin well with no adverse effects.   - Comprehensive metabolic panel - Lipid panel  5. Adult hypothyroidism  - TSH  The entirety of the information documented in the History of Present Illness, Review of Systems and Physical Exam were personally obtained by me. Portions of this information were initially documented by Meyer Cory, CMA and reviewed by me for thoroughness and accuracy.   Lelon Huh, MD  [facility]

## 2019-03-02 NOTE — Patient Instructions (Signed)
.   Please review the attached list of medications and notify my office if there are any errors.   . Please bring all of your medications to every appointment so we can make sure that our medication list is the same as yours.   

## 2019-03-03 ENCOUNTER — Telehealth: Payer: Self-pay

## 2019-03-03 DIAGNOSIS — E1169 Type 2 diabetes mellitus with other specified complication: Secondary | ICD-10-CM

## 2019-03-03 LAB — COMPREHENSIVE METABOLIC PANEL
ALT: 26 IU/L (ref 0–32)
AST: 24 IU/L (ref 0–40)
Albumin/Globulin Ratio: 2.1 (ref 1.2–2.2)
Albumin: 4.5 g/dL (ref 3.8–4.9)
Alkaline Phosphatase: 75 IU/L (ref 39–117)
BUN/Creatinine Ratio: 16 (ref 9–23)
BUN: 15 mg/dL (ref 6–24)
Bilirubin Total: 0.4 mg/dL (ref 0.0–1.2)
CO2: 22 mmol/L (ref 20–29)
Calcium: 9.6 mg/dL (ref 8.7–10.2)
Chloride: 99 mmol/L (ref 96–106)
Creatinine, Ser: 0.95 mg/dL (ref 0.57–1.00)
GFR calc Af Amer: 76 mL/min/{1.73_m2} (ref >59)
GFR calc non Af Amer: 66 mL/min/{1.73_m2} (ref >59)
Globulin, Total: 2.1 g/dL (ref 1.5–4.5)
Glucose: 118 mg/dL — ABNORMAL HIGH (ref 65–99)
Potassium: 4.6 mmol/L (ref 3.5–5.2)
Sodium: 141 mmol/L (ref 134–144)
Total Protein: 6.6 g/dL (ref 6.0–8.5)

## 2019-03-03 LAB — HEMOGLOBIN A1C
Est. average glucose Bld gHb Est-mCnc: 137 mg/dL
Hgb A1c MFr Bld: 6.4 % — ABNORMAL HIGH (ref 4.8–5.6)

## 2019-03-03 LAB — LIPID PANEL
Chol/HDL Ratio: 3.6 ratio (ref 0.0–4.4)
Cholesterol, Total: 153 mg/dL (ref 100–199)
HDL: 43 mg/dL (ref >39)
LDL Calculated: 64 mg/dL (ref 0–99)
Triglycerides: 232 mg/dL — ABNORMAL HIGH (ref 0–149)
VLDL Cholesterol Cal: 46 mg/dL — ABNORMAL HIGH (ref 5–40)

## 2019-03-03 LAB — TSH: TSH: 0.291 u[IU]/mL — ABNORMAL LOW (ref 0.450–4.500)

## 2019-03-03 LAB — HEPATITIS C ANTIBODY: Hep C Virus Ab: <0.1 s/co ratio (ref 0.0–0.9)

## 2019-03-03 MED ORDER — OZEMPIC (1 MG/DOSE) 2 MG/1.5ML ~~LOC~~ SOPN: 1.0000 mg | PEN_INJECTOR | SUBCUTANEOUS | 5 refills | Status: DC

## 2019-03-03 NOTE — Telephone Encounter (Signed)
I called patient. She wants prescription sent to [address]. Prescription sent.

## 2019-03-03 NOTE — Telephone Encounter (Signed)
Patient advised. She agrees to increase Ozempic to 1.0 mg. She requested the RX be sent to [facility]. Follow up appointment scheduled.

## 2019-03-03 NOTE — Telephone Encounter (Signed)
Which [facility] ??

## 2019-03-03 NOTE — Telephone Encounter (Signed)
-----   Message from [name], MD sent at 03/03/2019  9:13 AM EDT ----- Cholesterol is good at 153.  a1c is good at 6.4  Normal kidney functions, liver functions thyroid functions and electrolytes.  Ozempic is working well for diabetes. Higher doses help more with weight loss. Its up to her if she wants to continue the 0.5mg  dose, or increase to 1.0mg  which would probably help her lose weight.  Either way, schedule follow up in 4-5 months.

## 2019-04-20 ENCOUNTER — Other Ambulatory Visit: Payer: Self-pay | Admitting: Family Medicine

## 2019-06-15 ENCOUNTER — Other Ambulatory Visit: Payer: Self-pay | Admitting: Family Medicine

## 2019-07-12 ENCOUNTER — Other Ambulatory Visit: Payer: Self-pay | Admitting: Family Medicine

## 2019-07-22 ENCOUNTER — Ambulatory Visit: Payer: BC Managed Care – PPO | Admitting: Family Medicine

## 2019-07-27 ENCOUNTER — Ambulatory Visit: Payer: BC Managed Care – PPO | Admitting: Family Medicine

## 2019-07-29 ENCOUNTER — Ambulatory Visit: Payer: Self-pay | Admitting: Family Medicine

## 2019-08-03 ENCOUNTER — Encounter: Payer: Self-pay | Admitting: Family Medicine

## 2019-08-03 ENCOUNTER — Ambulatory Visit (INDEPENDENT_AMBULATORY_CARE_PROVIDER_SITE_OTHER): Payer: BC Managed Care – PPO | Admitting: Family Medicine

## 2019-08-03 ENCOUNTER — Other Ambulatory Visit: Payer: Self-pay

## 2019-08-03 VITALS — BP 110/72 | HR 88 | Temp 96.8°F | Wt 119.0 lb

## 2019-08-03 DIAGNOSIS — E1169 Type 2 diabetes mellitus with other specified complication: Secondary | ICD-10-CM | POA: Diagnosis not present

## 2019-08-03 DIAGNOSIS — Z23 Encounter for immunization: Secondary | ICD-10-CM

## 2019-08-03 DIAGNOSIS — I10 Essential (primary) hypertension: Secondary | ICD-10-CM | POA: Diagnosis not present

## 2019-08-03 DIAGNOSIS — Z86010 Personal history of colonic polyps: Secondary | ICD-10-CM | POA: Diagnosis not present

## 2019-08-03 DIAGNOSIS — E785 Hyperlipidemia, unspecified: Secondary | ICD-10-CM | POA: Diagnosis not present

## 2019-08-03 DIAGNOSIS — Z860101 Personal history of adenomatous and serrated colon polyps: Secondary | ICD-10-CM | POA: Insufficient documentation

## 2019-08-03 LAB — POCT GLYCOSYLATED HEMOGLOBIN (HGB A1C)
Est. average glucose Bld gHb Est-mCnc: 114
Hemoglobin A1C: 5.6 % (ref 4.0–5.6)

## 2019-08-03 MED ORDER — OZEMPIC (1 MG/DOSE) 2 MG/1.5ML ~~LOC~~ SOPN: 1.0000 mg | PEN_INJECTOR | SUBCUTANEOUS | 4 refills | Status: DC

## 2019-08-03 NOTE — Patient Instructions (Signed)
.   Please review the attached list of medications and notify my office if there are any errors.   . Please bring all of your medications to every appointment so we can make sure that our medication list is the same as yours.   

## 2019-08-03 NOTE — Addendum Note (Signed)
Addended by: [name] on: 08/03/2019 09:07 AM   Modules accepted: Orders

## 2019-08-03 NOTE — Progress Notes (Addendum)
Patient: Barbara Harper Female    DOB: [birth year]   58 y.o.   MRN: [id number] Visit Date: 08/03/2019  Today's Provider: Lelon Huh, MD   Chief Complaint  Patient presents with  . Diabetes  . Hyperlipidemia  . Hypertension   Subjective:     HPI  Diabetes Mellitus Type II, Follow-up:   Recent Labs       Lab Results  Component Value Date   HGBA1C 7.8 (A) 11/28/2018   HGBA1C 7.0 (A) 05/28/2018   HGBA1C 6.8 12/24/2017     Last seen for diabetes 5 months ago.  Management since then includes increasing Ozempic to 0.1 mg. Patient states she did not take the injection last week, but did last night. She reports good compliance with treatment. She is not having side effects.  Current symptoms include none  Home blood sugar records: not being checked  Episodes of hypoglycemia? no              Current Insulin Regimen: Ozempic Most Recent Eye Exam: over 1 year ago Weight trend: stable Prior visit with dietician: no Current diet: well balanced Current exercise: none  ------------------------------------------------------------------------   Hypertension, follow-up:     BP Readings from Last 3 Encounters:  03/02/19 108/64  11/28/18 122/64  06/10/18 132/70    She was last seen for hypertension 5 months ago.  BP at that visit was 108/64. Management since that visit includes no changes. She reports good compliance with treatment. She is not having side effects.  She is not exercising.  She is not adherent to low salt diet.   Outside blood pressures are not being checked. She is experiencing none.  Patient denies chest pain, chest pressure/discomfort, claudication, dyspnea, exertional chest pressure/discomfort, fatigue, irregular heart beat, lower extremity edema, near-syncope, orthopnea, palpitations, paroxysmal nocturnal dyspnea, syncope and tachypnea.   Cardiovascular risk factors include diabetes mellitus, dyslipidemia and hypertension.  Use of  agents associated with hypertension: NSAIDS.   ------------------------------------------------------------------------   Lipid/Cholesterol, Follow-up:   Last seen for this 5 months ago.  Management since that visit includes none.  Last Lipid Panel: Lab Results  Component Value Date   CHOL 153 03/02/2019   HDL 43 03/02/2019   LDLCALC 64 03/02/2019   TRIG 232 (H) 03/02/2019   CHOLHDL 3.6 03/02/2019   She reports good compliance with treatment. She is not having side effects.      Wt Readings from Last 3 Encounters:  03/02/19 133 lb (60.3 kg)  11/28/18 144 lb (65.3 kg)  06/10/18 142 lb (64.4 kg)    ------------------------------------------------------------------------  Follow up for Hypothyroidism:  The patient was last seen for this 5 months ago. Changes made at last visit include continue Levothyroxine 156mg daily. She reports good compliance with treatment. She feels that condition is unchanged. She is not having side effects.  Lab Results  Component Value Date   TSH 0.291 (L) 03/02/2019    She does reports very stressful job working 14-16 hours a day on [employer] Is working all remote. No other new complaints. States has cut metformin back to one daily and taking 157mOzempic which she is tolerating well.   Allergies  Allergen Reactions  . Multivitamin  [Centrum]     in 20's due to trace element in MV that caused joint swelling per pt has resolved  . Penicillin G Hives     Current Outpatient Medications:  .  aspirin 81 MG tablet, Take 81 mg by mouth daily.,  Disp: , Rfl:  .  Blood Glucose Monitoring Suppl (ONE TOUCH ULTRA 2) w/Device KIT, Use to check sugar daily, Disp: 1 each, Rfl: 0 .  CALCIUM PO, Take by mouth., Disp: , Rfl:  .  FLUoxetine (PROZAC) 40 MG capsule, Take 1 capsule (40 mg total) by mouth daily., Disp: 90 capsule, Rfl: 4 .  gabapentin (NEURONTIN) 300 MG capsule, TAKE ONE CAPSULE BY MOUTH AT BEDTIME, Disp: 30 capsule, Rfl: 5 .   glucose blood (ONE TOUCH ULTRA TEST) test strip, Check sugar daily, Disp: 100 each, Rfl: 4 .  IRON PO, Take by mouth., Disp: , Rfl:  .  Lancets MISC, Use Once Daily, Disp: 100 each, Rfl: 5 .  levothyroxine (SYNTHROID, LEVOTHROID) 100 MCG tablet, Take 1 tablet (100 mcg total) by mouth daily., Disp: 90 tablet, Rfl: 1 .  lisinopril-hydrochlorothiazide (PRINZIDE,ZESTORETIC) 10-12.5 MG tablet, Take 1 tablet by mouth daily., Disp: 90 tablet, Rfl: 4 .  lovastatin (MEVACOR) 40 MG tablet, Take 1 tablet by mouth once daily, Disp: 90 tablet, Rfl: 3 .  metFORMIN (GLUCOPHAGE) 1000 MG tablet, Take 1 tablet (1,000 mg total) by mouth 2 (two) times daily with a meal. (Patient taking differently: Take 1,000 mg by mouth daily. ), Disp: 180 tablet, Rfl: 3 .  Multiple Vitamins-Minerals (MULTIVITAMIN ADULT PO), Take 1 tablet by mouth daily., Disp: , Rfl:  .  Semaglutide, 1 MG/DOSE, (OZEMPIC, 1 MG/DOSE,) 2 MG/1.5ML SOPN, Inject 1 mg into the skin once a week., Disp: 2 pen, Rfl: 5 .  vitamin B-12 (CYANOCOBALAMIN) 1000 MCG tablet, Take 1 tablet by mouth daily., Disp: , Rfl:  .  zolpidem (AMBIEN) 10 MG tablet, TAKE 1 TABLET BY MOUTH AT BEDTIME AS NEEDED, Disp: 30 tablet, Rfl: 3  Review of Systems  Constitutional: Negative.   Respiratory: Negative.   Cardiovascular: Negative.   Endocrine: Negative.   Musculoskeletal: Negative.     Social History   Tobacco Use  . Smoking status: Never Smoker  . Smokeless tobacco: Never Used  Substance Use Topics  . Alcohol use: Yes    Alcohol/week: 0.0 standard drinks    Comment: DRINKS WINE      Objective:   BP 110/72 (BP Location: Right Arm, Patient Position: Sitting, Cuff Size: Normal)   Pulse 88   Temp (!) 96.8 F (36 C) (Temporal)   Wt 119 lb (54 kg)   LMP 01/10/2015   SpO2 99%   BMI 21.08 kg/m  Vitals:   08/03/19 0837  BP: 110/72  Pulse: 88  Temp: (!) 96.8 F (36 C)  TempSrc: Temporal  SpO2: 99%  Weight: 119 lb (54 kg)  Body mass index is 21.08 kg/m.    Physical Exam  General appearance: Well developed, well nourished female, cooperative and in no acute distress Head: Normocephalic, without obvious abnormality, atraumatic Respiratory: Respirations even and unlabored, normal respiratory rate Extremities: All extremities are intact.  Skin: Skin color, texture, turgor normal. No rashes seen  Psych: Appropriate mood and affect. Neurologic: Mental status: Alert, oriented to person, place, and time, thought content appropriate.  Results for orders placed or performed in visit on 08/03/19  POCT HgB A1C  Result Value Ref Range   Hemoglobin A1C 5.6 4.0 - 5.6 %   Est. average glucose Bld gHb Est-mCnc 114        Assessment & Plan    1. Type 2 diabetes mellitus with hyperlipidemia ([place]) Very well controlled. Continue current medications.  Follow up 4-5 months.   2. Need for influenza vaccination - Flu  Vaccine QUAD 6+ mos PF IM (Fluarix Quad PF)  3. Essential hypertension Well controlled.  Continue current medications.     The entirety of the information documented in the History of Present Illness, Review of Systems and Physical Exam were personally obtained by me. Portions of this information were initially documented by Idelle Jo, CMA and reviewed by me for thoroughness and accuracy.     Lelon Huh, MD  [facility]

## 2019-08-19 ENCOUNTER — Other Ambulatory Visit: Payer: Self-pay | Admitting: Family Medicine

## 2019-08-19 DIAGNOSIS — E039 Hypothyroidism, unspecified: Secondary | ICD-10-CM

## 2019-08-19 MED ORDER — LEVOTHYROXINE SODIUM 100 MCG PO TABS: 100.0000 ug | ORAL_TABLET | Freq: Every day | ORAL | 1 refills | Status: DC

## 2019-08-19 NOTE — Telephone Encounter (Signed)
[facility] faxed refill request for the following medications:  levothyroxine (SYNTHROID, LEVOTHROID) 100 MCG tablet  90 day supply Last Rx: 12/02/2018 90 day supply 1 refill   LOV: 08/03/2019 Please advise. Thanks TNP

## 2019-08-26 ENCOUNTER — Telehealth: Payer: Self-pay

## 2019-08-26 NOTE — Telephone Encounter (Signed)
OK, to change manufacturers for all of my patient's

## 2019-08-26 NOTE — Telephone Encounter (Signed)
Pharmacist advised

## 2019-08-26 NOTE — Telephone Encounter (Signed)
  [facility] pharmacy has changed manufactures for Levothyroxine. Pharmacist wants to know if it is ok for pharacy to switch patient to the new manufacture ?   Copied from [place] [phone]. Topic: General - Inquiry >> Aug 26, 2019  8:52 AM Mathis Bud wrote: Reason for CRM: [place] called regarding patient medication levothyroxine (SYNTHROID) 100 MCG  She would like to know if patient can switch manufactures.   Also she would like to know if the whole office can switch manufactures for all patient for this medication   Call back [phone]

## 2019-10-02 ENCOUNTER — Other Ambulatory Visit: Payer: Self-pay | Admitting: Family Medicine

## 2019-10-02 NOTE — Telephone Encounter (Signed)
Requested medication (s) are due for refill today: yes  Requested medication (s) are on the active medication list: yes  Last refill:  09/05/2019  Future visit scheduled: yes  Notes to clinic:  This refill cannot be delegated   Requested Prescriptions  Pending Prescriptions Disp Refills   zolpidem (AMBIEN) 10 MG tablet [Pharmacy Med Name: Zolpidem Tartrate 10 MG Oral Tablet] 30 tablet 0    Sig: TAKE 1 TABLET BY MOUTH AT BEDTIME AS NEEDED      Not Delegated - Psychiatry:  Anxiolytics/Hypnotics Failed - 10/02/2019  5:30 AM      Failed - This refill cannot be delegated      Failed - Urine Drug Screen completed in last 360 days.      Passed - Valid encounter within last 6 months    Recent Outpatient Visits           2 months ago Type 2 diabetes mellitus with hyperlipidemia [facility])   [facility] [name], MD   7 months ago Type 2 diabetes mellitus with hyperlipidemia [facility])   [facility] [name], MD   10 months ago Type 2 diabetes mellitus with hyperlipidemia [facility])   [name]   1 year ago Need for influenza vaccination   [facility] [name], MD   1 year ago Cervical cancer screening   [facility] [name], MD       Future Appointments             In 4 months [name], [name], MD [facility], [place]

## 2019-10-04 ENCOUNTER — Other Ambulatory Visit: Payer: Self-pay | Admitting: Family Medicine

## 2019-11-07 DIAGNOSIS — Z23 Encounter for immunization: Secondary | ICD-10-CM | POA: Diagnosis not present

## 2019-12-05 DIAGNOSIS — Z23 Encounter for immunization: Secondary | ICD-10-CM | POA: Diagnosis not present

## 2019-12-09 DIAGNOSIS — Z01818 Encounter for other preprocedural examination: Secondary | ICD-10-CM | POA: Diagnosis not present

## 2019-12-14 DIAGNOSIS — K640 First degree hemorrhoids: Secondary | ICD-10-CM | POA: Diagnosis not present

## 2019-12-14 DIAGNOSIS — K5730 Diverticulosis of large intestine without perforation or abscess without bleeding: Secondary | ICD-10-CM | POA: Diagnosis not present

## 2019-12-14 DIAGNOSIS — Z1211 Encounter for screening for malignant neoplasm of colon: Secondary | ICD-10-CM | POA: Diagnosis not present

## 2019-12-14 DIAGNOSIS — Z86010 Personal history of colonic polyps: Secondary | ICD-10-CM | POA: Diagnosis not present

## 2019-12-14 LAB — HM COLONOSCOPY

## 2020-01-29 NOTE — Progress Notes (Signed)
Established patient visit   Patient: Barbara Harper   DOB: 1960/12/07   59 y.o. Female  MRN: [id number] Visit Date: 02/01/2020  Today's healthcare provider: Lelon Huh, MD   Chief Complaint  Patient presents with  . Diabetes  . Hypertension  . Hyperlipidemia   Mertie Moores as a scribe for Lelon Huh, MD.,have documented all relevant documentation on the behalf of Lelon Huh, MD,as directed by  Lelon Huh, MD while in the presence of Lelon Huh, MD.  Subjective    Back Pain This is a new problem. The current episode started yesterday. The problem occurs constantly. The problem has been gradually worsening since onset. The pain is present in the lumbar spine. The quality of the pain is described as aching. The symptoms are aggravated by bending, sitting, twisting, standing, position and lying down. She has tried NSAIDs for the symptoms. The treatment provided no relief.   Diabetes Mellitus Type II, follow-up  Lab Results  Component Value Date   HGBA1C 5.6 08/03/2019   HGBA1C 6.4 (H) 03/02/2019   HGBA1C 7.8 (A) 11/28/2018   Last seen for diabetes 6 months ago.   Management since then includes continuing the same treatment. She reports good compliance with treatment. She is not having side effects.   Home blood sugar records: fasting range: not checking at home  Episodes of hypoglycemia? No    Current insulin regiment: Ozempic Most Recent Eye Exam: not UTD  --------------------------------------------------------------------------------------------------- Hypertension, follow-up  BP Readings from Last 3 Encounters:  02/01/20 98/64  08/03/19 110/72  03/02/19 108/64   Wt Readings from Last 3 Encounters:  02/01/20 122 lb 9.6 oz (55.6 kg)  08/03/19 119 lb (54 kg)  03/02/19 133 lb (60.3 kg)     She was last seen for hypertension 6 months ago.  BP at that visit was 08/03/2019. Management since that visit includes no change. She reports  good compliance with treatment. She is not having side effects.  She is exercising. She is not adherent to low salt diet.   Outside blood pressures are being checked occasionally.  She does not smoke.  Use of agents associated with hypertension: none.   --------------------------------------------------------------------------------------------------- Lipid/Cholesterol, follow-up  Last Lipid Panel: Lab Results  Component Value Date   CHOL 153 03/02/2019   LDLCALC 64 03/02/2019   HDL 43 03/02/2019   TRIG 232 (H) 03/02/2019    She was last seen for this 6 months ago.  Management since that visit includes no change.  She reports good compliance with treatment. She is not having side effects.   Symptoms: No appetite changes No foot ulcerations  No chest pain No chest pressure/discomfort  No dyspnea No orthopnea  No fatigue No lower extremity edema  No palpitations No paroxysmal nocturnal dyspnea  No nausea No numbness or tingling of extremity  No polydipsia No polyuria  No speech difficulty Yes syncope   She is following a Regular diet. Current exercise: walking  Last metabolic panel Lab Results  Component Value Date   GLUCOSE 118 (H) 03/02/2019   NA 141 03/02/2019   K 4.6 03/02/2019   BUN 15 03/02/2019   CREATININE 0.95 03/02/2019   GFRNONAA 66 03/02/2019   GFRAA 76 03/02/2019   CALCIUM 9.6 03/02/2019   AST 24 03/02/2019   ALT 26 03/02/2019   The 10-year ASCVD risk score Mikey Bussing DC Jr., et al., 2013) is: 4.3%  ---------------------------------------------------------------------------------------------------      Medications: Outpatient Medications Prior to Visit  Medication Sig  .  aspirin 81 MG tablet Take 81 mg by mouth daily.  . Blood Glucose Monitoring Suppl (ONE TOUCH ULTRA 2) w/Device KIT Use to check sugar daily  . CALCIUM PO Take by mouth.  . gabapentin (NEURONTIN) 300 MG capsule TAKE ONE CAPSULE BY MOUTH AT BEDTIME  . glucose blood (ONE TOUCH  ULTRA TEST) test strip Check sugar daily  . IRON PO Take by mouth.  . Lancets MISC Use Once Daily  . levothyroxine (SYNTHROID) 100 MCG tablet Take 1 tablet (100 mcg total) by mouth daily.  Marland Kitchen lovastatin (MEVACOR) 40 MG tablet Take 1 tablet by mouth once daily  . metFORMIN (GLUCOPHAGE) 1000 MG tablet Take 1 tablet (1,000 mg total) by mouth 2 (two) times daily with a meal. (Patient taking differently: Take 1,000 mg by mouth daily. )  . Multiple Vitamins-Minerals (MULTIVITAMIN ADULT PO) Take 1 tablet by mouth daily.  . Semaglutide, 1 MG/DOSE, (OZEMPIC, 1 MG/DOSE,) 2 MG/1.5ML SOPN Inject 1 mg into the skin once a week.  . vitamin B-12 (CYANOCOBALAMIN) 1000 MCG tablet Take 1 tablet by mouth daily.  Marland Kitchen zolpidem (AMBIEN) 10 MG tablet TAKE 1 TABLET BY MOUTH AT BEDTIME AS NEEDED  . [DISCONTINUED] FLUoxetine (PROZAC) 40 MG capsule Take 1 capsule (40 mg total) by mouth daily.  . [DISCONTINUED] lisinopril-hydrochlorothiazide (PRINZIDE,ZESTORETIC) 10-12.5 MG tablet Take 1 tablet by mouth daily.   No facility-administered medications prior to visit.    Review of Systems  Constitutional: Negative.   Respiratory: Negative.   Cardiovascular: Negative.   Musculoskeletal: Positive for back pain.  Skin: Positive for rash.      Objective    BP 98/64 (BP Location: Right Arm, Patient Position: Sitting, Cuff Size: Normal)   Pulse 87   Temp (!) 96.8 F (36 C) (Temporal)   Ht 5' 3"  (1.6 m)   Wt 122 lb 9.6 oz (55.6 kg)   LMP 01/10/2015   BMI 21.72 kg/m    Physical Exam   General: Appearance:    Well developed, well nourished female in no acute distress  Eyes:    PERRL, conjunctiva/corneas clear, EOM's intact       Lungs:     Clear to auscultation bilaterally, respirations unlabored  Heart:    Normal heart rate. Normal rhythm. No murmurs, rubs, or gallops.   MS:   All extremities are intact.   Skin:    Faint circular red patches dorsum of left foot just proximal to toes.   MS:   Mild tenderness right  lower thoracic and upper lumbar musculature with some spasm. No gross deformities.  Neurologic:   Awake, alert, oriented x 3. No apparent focal neurological           defect.       No results found for any visits on 02/01/20.  Assessment & Plan    1. Type 2 diabetes mellitus with hyperlipidemia ([facility]) Well controlled with last A1c 5.6 Continue current medications On ACEi On Statin Discussed diet and exercise F/u in 4 months - HgB A1c  2. Essential hypertension BP reading is a little low today. Switch Lisinopril-HCTZ to Lisinopril 10 mg daily Recheck metabolic panel F/u in 3-4 months  - CBC with Differential - Comprehensive Metabolic Panel (CMET) - TSH - lisinopril (ZESTRIL) 10 MG tablet; Take 1 tablet (10 mg total) by mouth daily.  Dispense: 90 tablet; Refill: 1  3. Hyperlipidemia, unspecified hyperlipidemia type Previously well controlled Continue statin Repeat FLP and CMP Goal LDL < 70  - Lipid Profile  4. Cervical  cancer screening   5. Encounter for screening mammogram for malignant neoplasm of breast  - MM Digital Screening; Future  6. Tinea pedis of left foot  - clotrimazole-betamethasone (LOTRISONE) cream; Apply 1 application topically 2 (two) times daily.  Dispense: 30 g; Refill: 0  7. Acute right-sided low back pain without sciatica Can take Ibuprofen as needed also - cyclobenzaprine (FLEXERIL) 5 MG tablet; Take 1 tablet (5 mg total) by mouth 3 (three) times daily as needed for muscle spasms.  Dispense: 30 tablet; Refill: 0   Return in about 4 months (around 06/03/2020).      The entirety of the information documented in the History of Present Illness, Review of Systems and Physical Exam were personally obtained by me. Portions of this information were initially documented by the CMA and reviewed by me for thoroughness and accuracy.      Lelon Huh, MD  [facility] [phone] (phone) [phone] (fax)  [place]

## 2020-02-01 ENCOUNTER — Encounter: Payer: Self-pay | Admitting: Family Medicine

## 2020-02-01 ENCOUNTER — Ambulatory Visit (INDEPENDENT_AMBULATORY_CARE_PROVIDER_SITE_OTHER): Payer: BC Managed Care – PPO | Admitting: Family Medicine

## 2020-02-01 ENCOUNTER — Other Ambulatory Visit: Payer: Self-pay

## 2020-02-01 VITALS — BP 98/64 | HR 87 | Temp 96.8°F | Ht 63.0 in | Wt 122.6 lb

## 2020-02-01 DIAGNOSIS — I10 Essential (primary) hypertension: Secondary | ICD-10-CM | POA: Diagnosis not present

## 2020-02-01 DIAGNOSIS — E785 Hyperlipidemia, unspecified: Secondary | ICD-10-CM | POA: Diagnosis not present

## 2020-02-01 DIAGNOSIS — Z1231 Encounter for screening mammogram for malignant neoplasm of breast: Secondary | ICD-10-CM

## 2020-02-01 DIAGNOSIS — Z124 Encounter for screening for malignant neoplasm of cervix: Secondary | ICD-10-CM | POA: Diagnosis not present

## 2020-02-01 DIAGNOSIS — M545 Low back pain, unspecified: Secondary | ICD-10-CM

## 2020-02-01 DIAGNOSIS — E1169 Type 2 diabetes mellitus with other specified complication: Secondary | ICD-10-CM | POA: Diagnosis not present

## 2020-02-01 DIAGNOSIS — B353 Tinea pedis: Secondary | ICD-10-CM

## 2020-02-01 MED ORDER — CYCLOBENZAPRINE HCL 5 MG PO TABS: 5.0000 mg | ORAL_TABLET | Freq: Three times a day (TID) | ORAL | 0 refills | Status: AC | PRN

## 2020-02-01 MED ORDER — CLOTRIMAZOLE-BETAMETHASONE 1-0.05 % EX CREA: 1.0000 "application " | TOPICAL_CREAM | Freq: Two times a day (BID) | CUTANEOUS | 0 refills | Status: DC

## 2020-02-01 MED ORDER — FLUOXETINE HCL 40 MG PO CAPS: 40.0000 mg | ORAL_CAPSULE | Freq: Every day | ORAL | 1 refills | Status: DC

## 2020-02-01 MED ORDER — LISINOPRIL 10 MG PO TABS: 10.0000 mg | ORAL_TABLET | Freq: Every day | ORAL | 1 refills | Status: DC

## 2020-02-02 ENCOUNTER — Encounter: Payer: Self-pay | Admitting: Family Medicine

## 2020-02-02 DIAGNOSIS — Z86010 Personal history of colonic polyps: Secondary | ICD-10-CM

## 2020-02-02 DIAGNOSIS — K5730 Diverticulosis of large intestine without perforation or abscess without bleeding: Secondary | ICD-10-CM

## 2020-02-02 LAB — COMPREHENSIVE METABOLIC PANEL
ALT: 17 IU/L (ref 0–32)
AST: 20 IU/L (ref 0–40)
Albumin/Globulin Ratio: 2 (ref 1.2–2.2)
Albumin: 4.5 g/dL (ref 3.8–4.9)
Alkaline Phosphatase: 79 IU/L (ref 48–121)
BUN/Creatinine Ratio: 16 (ref 9–23)
BUN: 18 mg/dL (ref 6–24)
Bilirubin Total: 0.4 mg/dL (ref 0.0–1.2)
CO2: 21 mmol/L (ref 20–29)
Calcium: 9.4 mg/dL (ref 8.7–10.2)
Chloride: 98 mmol/L (ref 96–106)
Creatinine, Ser: 1.13 mg/dL — ABNORMAL HIGH (ref 0.57–1.00)
GFR calc Af Amer: 61 mL/min/{1.73_m2} (ref >59)
GFR calc non Af Amer: 53 mL/min/{1.73_m2} — ABNORMAL LOW (ref >59)
Globulin, Total: 2.2 g/dL (ref 1.5–4.5)
Glucose: 159 mg/dL — ABNORMAL HIGH (ref 65–99)
Potassium: 3.6 mmol/L (ref 3.5–5.2)
Sodium: 139 mmol/L (ref 134–144)
Total Protein: 6.7 g/dL (ref 6.0–8.5)

## 2020-02-02 LAB — CBC WITH DIFFERENTIAL/PLATELET
Basophils Absolute: 0.2 10*3/uL (ref 0.0–0.2)
Basos: 2 %
EOS (ABSOLUTE): 0.3 10*3/uL (ref 0.0–0.4)
Eos: 3 %
Hematocrit: 40 % (ref 34.0–46.6)
Hemoglobin: 13.2 g/dL (ref 11.1–15.9)
Immature Grans (Abs): 0 10*3/uL (ref 0.0–0.1)
Immature Granulocytes: 0 %
Lymphocytes Absolute: 2 10*3/uL (ref 0.7–3.1)
Lymphs: 18 %
MCH: 30.8 pg (ref 26.6–33.0)
MCHC: 33 g/dL (ref 31.5–35.7)
MCV: 94 fL (ref 79–97)
Monocytes Absolute: 1.1 10*3/uL — ABNORMAL HIGH (ref 0.1–0.9)
Monocytes: 10 %
Neutrophils Absolute: 7.4 10*3/uL — ABNORMAL HIGH (ref 1.4–7.0)
Neutrophils: 67 %
Platelets: 343 10*3/uL (ref 150–450)
RBC: 4.28 x10E6/uL (ref 3.77–5.28)
RDW: 12.2 % (ref 11.7–15.4)
WBC: 11 10*3/uL — ABNORMAL HIGH (ref 3.4–10.8)

## 2020-02-02 LAB — TSH: TSH: 0.928 u[IU]/mL (ref 0.450–4.500)

## 2020-02-02 LAB — LIPID PANEL
Chol/HDL Ratio: 3.3 ratio (ref 0.0–4.4)
Cholesterol, Total: 175 mg/dL (ref 100–199)
HDL: 53 mg/dL (ref >39)
LDL Chol Calc (NIH): 96 mg/dL (ref 0–99)
Triglycerides: 150 mg/dL — ABNORMAL HIGH (ref 0–149)
VLDL Cholesterol Cal: 26 mg/dL (ref 5–40)

## 2020-02-02 LAB — HEMOGLOBIN A1C
Est. average glucose Bld gHb Est-mCnc: 123 mg/dL
Hgb A1c MFr Bld: 5.9 % — ABNORMAL HIGH (ref 4.8–5.6)

## 2020-02-19 ENCOUNTER — Other Ambulatory Visit: Payer: Self-pay | Admitting: Family Medicine

## 2020-02-19 NOTE — Telephone Encounter (Signed)
Requested medication (s) are due for refill today: yes  Requested medication (s) are on the active medication list: yes  Last refill:  01/23/2020  Future visit scheduled: yes  Notes to clinic: this refill cannot be delegated    Requested Prescriptions  Pending Prescriptions Disp Refills   zolpidem (AMBIEN) 10 MG tablet [Pharmacy Med Name: Zolpidem Tartrate 10 MG Oral Tablet] 30 tablet 0    Sig: TAKE 1 TABLET BY MOUTH AT BEDTIME AS NEEDED      Not Delegated - Psychiatry:  Anxiolytics/Hypnotics Failed - 02/19/2020  5:30 AM      Failed - This refill cannot be delegated      Failed - Urine Drug Screen completed in last 360 days.      Passed - Valid encounter within last 6 months    Recent Outpatient Visits           2 weeks ago Type 2 diabetes mellitus with hyperlipidemia [facility])   [facility] [name], MD   6 months ago Type 2 diabetes mellitus with hyperlipidemia [facility])   [facility] [name], MD   11 months ago Type 2 diabetes mellitus with hyperlipidemia [facility])   [facility] [name], MD   1 year ago Type 2 diabetes mellitus with hyperlipidemia [facility])   [facility] [name], MD   1 year ago Need for influenza vaccination   [facility] [name], MD       Future Appointments             In 3 months [name], [name], MD [facility], [place]

## 2020-02-20 ENCOUNTER — Other Ambulatory Visit: Payer: Self-pay | Admitting: Family Medicine

## 2020-02-20 NOTE — Telephone Encounter (Signed)
Requested Prescriptions  Pending Prescriptions Disp Refills  . FLUoxetine (PROZAC) 40 MG capsule [Pharmacy Med Name: FLUoxetine HCl 40 MG Oral Capsule] 90 capsule 1    Sig: Take 1 capsule by mouth once daily     Psychiatry:  Antidepressants - SSRI Passed - 02/20/2020 10:02 AM      Passed - Valid encounter within last 6 months    Recent Outpatient Visits          2 weeks ago Type 2 diabetes mellitus with hyperlipidemia [facility])   [facility] [name], MD   6 months ago Type 2 diabetes mellitus with hyperlipidemia [facility])   [facility] [name], MD   11 months ago Type 2 diabetes mellitus with hyperlipidemia [facility])   [facility] [name], MD   1 year ago Type 2 diabetes mellitus with hyperlipidemia [facility])   [facility]  [name], MD   1 year ago Need for influenza vaccination   [facility] [name], MD      Future Appointments            In 3 months [name], [name], MD [facility], [place]

## 2020-06-03 ENCOUNTER — Ambulatory Visit: Payer: BC Managed Care – PPO | Admitting: Family Medicine

## 2020-06-13 ENCOUNTER — Other Ambulatory Visit: Payer: Self-pay | Admitting: Family Medicine

## 2020-06-14 ENCOUNTER — Other Ambulatory Visit: Payer: Self-pay | Admitting: Family Medicine

## 2020-06-28 ENCOUNTER — Other Ambulatory Visit: Payer: Self-pay

## 2020-06-28 ENCOUNTER — Encounter: Payer: Self-pay | Admitting: Family Medicine

## 2020-06-28 ENCOUNTER — Ambulatory Visit (INDEPENDENT_AMBULATORY_CARE_PROVIDER_SITE_OTHER): Payer: BC Managed Care – PPO | Admitting: Family Medicine

## 2020-06-28 VITALS — BP 130/80 | HR 62 | Temp 98.0°F | Ht 63.0 in | Wt 127.4 lb

## 2020-06-28 DIAGNOSIS — I10 Essential (primary) hypertension: Secondary | ICD-10-CM | POA: Diagnosis not present

## 2020-06-28 DIAGNOSIS — Z23 Encounter for immunization: Secondary | ICD-10-CM | POA: Diagnosis not present

## 2020-06-28 DIAGNOSIS — E785 Hyperlipidemia, unspecified: Secondary | ICD-10-CM

## 2020-06-28 DIAGNOSIS — E1169 Type 2 diabetes mellitus with other specified complication: Secondary | ICD-10-CM | POA: Diagnosis not present

## 2020-06-28 LAB — POCT GLYCOSYLATED HEMOGLOBIN (HGB A1C)
Est. average glucose Bld gHb Est-mCnc: 120
Hemoglobin A1C: 5.8 % — AB (ref 4.0–5.6)

## 2020-06-28 NOTE — Patient Instructions (Signed)
Please review the attached list of medications and notify my office if there are any errors.  ? ?Please call the [facility] ([phone]) to schedule a routine screening mammogram. ? ?

## 2020-06-28 NOTE — Progress Notes (Signed)
Established patient visit   Patient: Barbara Harper   DOB: 1961/04/11   59 y.o. Female  MRN: [id number] Visit Date: 06/28/2020  Today's healthcare provider: Lelon Huh, MD   Chief Complaint  Patient presents with  . Diabetes  . Hyperlipidemia  . Hypertension   Subjective    HPI  Diabetes Mellitus Type II, follow-up  Lab Results  Component Value Date   HGBA1C 5.9 (H) 02/01/2020   HGBA1C 5.6 08/03/2019   HGBA1C 6.4 (H) 03/02/2019   Last seen for diabetes 5 months ago.  Management since then includes continuing the same treatment. She reports good compliance with treatment. She is not having side effects.   Home blood sugar records: fasting range: not being checked at home  Episodes of hypoglycemia? No    Current insulin regiment: None Most Recent Eye Exam: not UTD  --------------------------------------------------------------------------------------------------- Hypertension, follow-up  BP Readings from Last 3 Encounters:  06/28/20 (!) 141/101  02/01/20 98/64  08/03/19 110/72   Wt Readings from Last 3 Encounters:  06/28/20 127 lb 6.4 oz (57.8 kg)  02/01/20 122 lb 9.6 oz (55.6 kg)  08/03/19 119 lb (54 kg)     She was last seen for hypertension 5 months ago.  BP at that visit was 98/64. Management since that visit includes switched Lisinopril-HCTZ to Lisinopril 10 mg. She reports good compliance with treatment. She is not having side effects.  She is exercising. She is adherent to low salt diet.   Outside blood pressures are not being checked at home.  She does not smoke.  Use of agents associated with hypertension: thyroid hormones.   --------------------------------------------------------------------------------------------------- Lipid/Cholesterol, follow-up  Last Lipid Panel: Lab Results  Component Value Date   CHOL 175 02/01/2020   LDLCALC 96 02/01/2020   HDL 53 02/01/2020   TRIG 150 (H) 02/01/2020    She was last seen for this 5  months ago.  Management since that visit includes no changes.  She reports good compliance with treatment. She is not having side effects.   Symptoms: No appetite changes No foot ulcerations  No chest pain No chest pressure/discomfort  No dyspnea No orthopnea  No fatigue No lower extremity edema  No palpitations No paroxysmal nocturnal dyspnea  No nausea No numbness or tingling of extremity  No polydipsia No polyuria  No speech difficulty No syncope   She is following a Regular diet. Current exercise: walking  Last metabolic panel Lab Results  Component Value Date   GLUCOSE 159 (H) 02/01/2020   NA 139 02/01/2020   K 3.6 02/01/2020   BUN 18 02/01/2020   CREATININE 1.13 (H) 02/01/2020   GFRNONAA 53 (L) 02/01/2020   GFRAA 61 02/01/2020   CALCIUM 9.4 02/01/2020   AST 20 02/01/2020   ALT 17 02/01/2020   The 10-year ASCVD risk score Mikey Bussing DC Jr., et al., 2013) is: 9.4%  ---------------------------------------------------------------------------------------------------      Medications: Outpatient Medications Prior to Visit  Medication Sig  . aspirin 81 MG tablet Take 81 mg by mouth daily.  . Blood Glucose Monitoring Suppl (ONE TOUCH ULTRA 2) w/Device KIT Use to check sugar daily  . CALCIUM PO Take by mouth.  . clotrimazole-betamethasone (LOTRISONE) cream Apply 1 application topically 2 (two) times daily.  . cyclobenzaprine (FLEXERIL) 5 MG tablet Take 1 tablet (5 mg total) by mouth 3 (three) times daily as needed for muscle spasms.  Marland Kitchen FLUoxetine (PROZAC) 40 MG capsule Take 1 capsule by mouth once daily  . gabapentin (NEURONTIN)  300 MG capsule TAKE ONE CAPSULE BY MOUTH AT BEDTIME  . glucose blood (ONE TOUCH ULTRA TEST) test strip Check sugar daily  . IRON PO Take by mouth.  . Lancets MISC Use Once Daily  . levothyroxine (SYNTHROID) 100 MCG tablet Take 1 tablet (100 mcg total) by mouth daily.  Marland Kitchen lisinopril (ZESTRIL) 10 MG tablet Take 1 tablet (10 mg total) by mouth daily.   Marland Kitchen lovastatin (MEVACOR) 40 MG tablet Take 1 tablet by mouth once daily  . metFORMIN (GLUCOPHAGE) 1000 MG tablet Take 1 tablet (1,000 mg total) by mouth 2 (two) times daily with a meal. (Patient taking differently: Take 1,000 mg by mouth daily. )  . Multiple Vitamins-Minerals (MULTIVITAMIN ADULT PO) Take 1 tablet by mouth daily.  . Semaglutide, 1 MG/DOSE, (OZEMPIC, 1 MG/DOSE,) 2 MG/1.5ML SOPN Inject 1 mg into the skin once a week.  . vitamin B-12 (CYANOCOBALAMIN) 1000 MCG tablet Take 1 tablet by mouth daily.  Marland Kitchen zolpidem (AMBIEN) 10 MG tablet TAKE 1 TABLET BY MOUTH AT BEDTIME AS NEEDED   No facility-administered medications prior to visit.    Review of Systems  Constitutional: Negative.   Respiratory: Negative.   Cardiovascular: Negative.   Musculoskeletal: Negative.       Objective    BP 130/80   Pulse 62   Temp 98 F (36.7 C) (Oral)   Ht 5' 3"  (1.6 m)   Wt 127 lb 6.4 oz (57.8 kg)   LMP 01/10/2015   SpO2 100%   BMI 22.57 kg/m    Physical Exam  General appearance: Well developed, well nourished female, cooperative and in no acute distress Head: Normocephalic, without obvious abnormality, atraumatic Respiratory: Respirations even and unlabored, normal respiratory rate Extremities: All extremities are intact.  Skin: Skin color, texture, turgor normal. No rashes seen  Psych: Appropriate mood and affect. Neurologic: Mental status: Alert, oriented to person, place, and time, thought content appropriate.   Results for orders placed or performed in visit on 06/28/20  POCT HgB A1C  Result Value Ref Range   Hemoglobin A1C 5.8 (A) 4.0 - 5.6 %   Est. average glucose Bld gHb Est-mCnc 120     Assessment & Plan     1. Type 2 diabetes mellitus with hyperlipidemia ([place]) Doing very well on current medications. Continue current medications.  Encouraged continue diabetic conscious diet and regular exercise.   2. Primary hypertension Fairly well controlled, Continue current  medications.     - Flu Vaccine QUAD 6+ mos PF IM (Fluarix Quad PF)   Future Appointments  Date Time Provider [place]  01/27/2021  9:40 AM Caryn Section, [name], MD [facility]     Reminded she is do for screening and mammogram and given contact information to schedule.      The entirety of the information documented in the History of Present Illness, Review of Systems and Physical Exam were personally obtained by me. Portions of this information were initially documented by the CMA and reviewed by me for thoroughness and accuracy.      Lelon Huh, MD  [facility] [phone] (phone) [phone] (fax)  [place]

## 2020-07-06 ENCOUNTER — Other Ambulatory Visit: Payer: Self-pay | Admitting: Family Medicine

## 2020-07-06 NOTE — Telephone Encounter (Signed)
Requested medication (s) are due for refill today- no  Requested medication (s) are on the active medication list -yes  Future visit scheduled -yes  Last refill: 06/13/20 #30 3RF  Notes to clinic: Patient is requesting an ok on an early RF of medication- she is going on vacation and needs it tomorrow  Requested Prescriptions  Pending Prescriptions Disp Refills   zolpidem (AMBIEN) 10 MG tablet 30 tablet 3    Sig: Take 1 tablet (10 mg total) by mouth at bedtime as needed.      Not Delegated - Psychiatry:  Anxiolytics/Hypnotics Failed - 07/06/2020  4:15 PM      Failed - This refill cannot be delegated      Failed - Urine Drug Screen completed in last 360 days.      Passed - Valid encounter within last 6 months    Recent Outpatient Visits           1 week ago Type 2 diabetes mellitus with hyperlipidemia [facility])   [facility] [name], MD   5 months ago Type 2 diabetes mellitus with hyperlipidemia [facility])   [facility] [name], MD   11 months ago Type 2 diabetes mellitus with hyperlipidemia [facility])   [facility] [name], MD   1 year ago Type 2 diabetes mellitus with hyperlipidemia [facility])   [facility] [name], MD   1 year ago Type 2 diabetes mellitus with hyperlipidemia [facility])   [place], [place]       Future Appointments             In 6 months [name], [name], MD [facility], [facility]                Requested Prescriptions  Pending Prescriptions Disp Refills   zolpidem (AMBIEN) 10 MG tablet 30 tablet 3    Sig: Take 1 tablet (10 mg total) by mouth at bedtime as needed.      Not Delegated - Psychiatry:  Anxiolytics/Hypnotics Failed - 07/06/2020  4:15 PM      Failed - This refill cannot be delegated      Failed - Urine Drug Screen completed in last 360 days.      Passed - Valid encounter within last 6 months    Recent Outpatient Visits            1 week ago Type 2 diabetes mellitus with hyperlipidemia [facility])   [facility] [name], MD   5 months ago Type 2 diabetes mellitus with hyperlipidemia [facility])   [facility] [name], MD   11 months ago Type 2 diabetes mellitus with hyperlipidemia [facility])   [facility] [name], MD   1 year ago Type 2 diabetes mellitus with hyperlipidemia [facility])   [facility] [name], MD   1 year ago Type 2 diabetes mellitus with hyperlipidemia [facility])   [facility] [name], MD       Future Appointments             In 6 months [name], [name], MD [facility], [place]

## 2020-07-06 NOTE — Telephone Encounter (Signed)
Pt called and is requesting to have her Zolpidem refilled early. She states that she is going on vacation, and that she is needing to have it refilled tomorrow if possible. Please advise.     [facility] [address] [place]  [place] [address]  Phone: [phone] Fax: [phone]  Hours: Not open 24 hours

## 2020-07-06 NOTE — Telephone Encounter (Signed)
Refills are already at Pharmacy from last prescription sent in on 06/13/2020. Patient wants you to contact pharmacy and give them an "OK" or authorize getting the refill early since she is going on vacation.

## 2020-07-08 NOTE — Telephone Encounter (Signed)
I contacted pharmacy and advised as below. I contacted patient and advised her also, but she states she is already on the road headed to [place]. Patient is going to try finding a pharmacy in [place] that will allow her to get the prescription that is already on file. She will call our office back if they require a new prescription to be sent in to the pharmacy in [place].

## 2020-07-08 NOTE — Telephone Encounter (Signed)
That's fine, they can dispense zolpidem now.

## 2020-07-09 ENCOUNTER — Other Ambulatory Visit: Payer: Self-pay | Admitting: Family Medicine

## 2020-07-09 DIAGNOSIS — E039 Hypothyroidism, unspecified: Secondary | ICD-10-CM

## 2020-07-09 NOTE — Telephone Encounter (Signed)
Requested Prescriptions  Pending Prescriptions Disp Refills  . EUTHYROX 100 MCG tablet [Pharmacy Med Name: Euthyrox 100 MCG Oral Tablet] 90 tablet 1    Sig: Take 1 tablet by mouth once daily     Endocrinology:  Hypothyroid Agents Failed - 07/09/2020  9:57 AM      Failed - TSH needs to be rechecked within 3 months after an abnormal result. Refill until TSH is due.      Passed - TSH in normal range and within 360 days    TSH  Date Value Ref Range Status  02/01/2020 0.928 0.450 - 4.500 uIU/mL Final         Passed - Valid encounter within last 12 months    Recent Outpatient Visits          1 week ago Type 2 diabetes mellitus with hyperlipidemia [facility])   [facility] [name], MD   5 months ago Type 2 diabetes mellitus with hyperlipidemia [facility])   [facility] [name], MD   11 months ago Type 2 diabetes mellitus with hyperlipidemia [facility])   [facility] [name], MD   1 year ago Type 2 diabetes mellitus with hyperlipidemia [facility])   [facility] [name], MD   1 year ago Type 2 diabetes mellitus with hyperlipidemia [facility])   [facility] [name], MD      Future Appointments            In 6 months [name], [name], MD [facility], [place]

## 2020-08-29 ENCOUNTER — Ambulatory Visit: Payer: BC Managed Care – PPO | Admitting: Family Medicine

## 2020-09-02 ENCOUNTER — Other Ambulatory Visit: Payer: Self-pay | Admitting: Family Medicine

## 2020-09-30 ENCOUNTER — Other Ambulatory Visit: Payer: Self-pay | Admitting: Family Medicine

## 2020-10-01 NOTE — Telephone Encounter (Signed)
Requested medications are due for refill today yes  Requested medications are on the active medication list yes  Last refill 12/17  Last visit 06/2020  Future visit scheduled 01/2021  Notes to clinic Not Delegated.

## 2020-10-04 ENCOUNTER — Other Ambulatory Visit: Payer: Self-pay | Admitting: Family Medicine

## 2020-10-04 DIAGNOSIS — I10 Essential (primary) hypertension: Secondary | ICD-10-CM

## 2020-11-08 DIAGNOSIS — E113393 Type 2 diabetes mellitus with moderate nonproliferative diabetic retinopathy without macular edema, bilateral: Secondary | ICD-10-CM | POA: Diagnosis not present

## 2020-11-08 LAB — HM DIABETES EYE EXAM

## 2020-12-24 ENCOUNTER — Other Ambulatory Visit: Payer: Self-pay | Admitting: Family Medicine

## 2020-12-24 NOTE — Telephone Encounter (Signed)
Requested medication (s) are due for refill today: no  Requested medication (s) are on the active medication list: yes  Last refill:  10/03/20   Future visit scheduled: yes  Notes to clinic:  Med not delegated to NT to RF   Requested Prescriptions  Pending Prescriptions Disp Refills   zolpidem (AMBIEN) 10 MG tablet [Pharmacy Med Name: Zolpidem Tartrate 10 MG Oral Tablet] 30 tablet 0    Sig: TAKE 1 TABLET BY MOUTH AT BEDTIME AS NEEDED      Not Delegated - Psychiatry:  Anxiolytics/Hypnotics Failed - 12/24/2020  2:56 PM      Failed - This refill cannot be delegated      Failed - Urine Drug Screen completed in last 360 days      Passed - Valid encounter within last 6 months    Recent Outpatient Visits           5 months ago Type 2 diabetes mellitus with hyperlipidemia [facility])   [facility] [name], MD   10 months ago Type 2 diabetes mellitus with hyperlipidemia [facility])   [facility] [name], MD   1 year ago Type 2 diabetes mellitus with hyperlipidemia [facility])   [facility] [name], MD   1 year ago Type 2 diabetes mellitus with hyperlipidemia [facility])   [facility] [name], MD   2 years ago Type 2 diabetes mellitus with hyper[facility])   [facility] [name], MD       Future Appointments             In 1 month [name], [name], MD [facility], [place]

## 2021-01-27 ENCOUNTER — Ambulatory Visit: Payer: BC Managed Care – PPO | Admitting: Family Medicine

## 2021-02-15 ENCOUNTER — Ambulatory Visit: Payer: BC Managed Care – PPO | Admitting: Family Medicine

## 2021-02-24 ENCOUNTER — Other Ambulatory Visit: Payer: Self-pay | Admitting: Family Medicine

## 2021-02-24 NOTE — Telephone Encounter (Signed)
  Notes to clinic: Patient has upcoming appt on 6/21/202 Review for short supply   Requested Prescriptions  Pending Prescriptions Disp Refills   FLUoxetine (PROZAC) 40 MG capsule [Pharmacy Med Name: FLUoxetine HCl 40 MG Oral Capsule] 90 capsule 0    Sig: Take 1 capsule by mouth once daily      Psychiatry:  Antidepressants - SSRI Failed - 02/24/2021 11:18 AM      Failed - Valid encounter within last 6 months    Recent Outpatient Visits           8 months ago Type 2 diabetes mellitus with hyperlipidemia [facility])   [facility] [name], MD   1 year ago Type 2 diabetes mellitus with hyperlipidemia [facility])   [facility] [name], MD   1 year ago Type 2 diabetes mellitus with hyperlipidemia [facility])   [facility] [name], MD   1 year ago Type 2 diabetes mellitus with hyperlipidemia [facility])   [facility] [name], MD   2 years ago Type 2 diabetes mellitus with hyperlipidemia [facility])   [facility] [name], MD       Future Appointments             In 1 week [name], [name], MD [facility], [place]

## 2021-03-07 ENCOUNTER — Ambulatory Visit: Payer: BC Managed Care – PPO | Admitting: Family Medicine

## 2021-03-24 ENCOUNTER — Other Ambulatory Visit: Payer: Self-pay | Admitting: Family Medicine

## 2021-03-24 NOTE — Telephone Encounter (Signed)
Requested medications are due for refill today yes  Requested medications are on the active medication list yes  Last refill 09/02/20  Last visit 06/2020  Future visit scheduled 04/14/21  Notes to clinic Not Delegated

## 2021-04-14 ENCOUNTER — Ambulatory Visit (INDEPENDENT_AMBULATORY_CARE_PROVIDER_SITE_OTHER): Payer: BC Managed Care – PPO | Admitting: Family Medicine

## 2021-04-14 ENCOUNTER — Encounter: Payer: Self-pay | Admitting: Family Medicine

## 2021-04-14 ENCOUNTER — Other Ambulatory Visit: Payer: Self-pay

## 2021-04-14 VITALS — BP 153/80 | HR 65 | Temp 97.9°F | Resp 16 | Ht 63.0 in | Wt 133.0 lb

## 2021-04-14 DIAGNOSIS — I10 Essential (primary) hypertension: Secondary | ICD-10-CM

## 2021-04-14 DIAGNOSIS — E039 Hypothyroidism, unspecified: Secondary | ICD-10-CM | POA: Diagnosis not present

## 2021-04-14 DIAGNOSIS — E1169 Type 2 diabetes mellitus with other specified complication: Secondary | ICD-10-CM | POA: Diagnosis not present

## 2021-04-14 DIAGNOSIS — E785 Hyperlipidemia, unspecified: Secondary | ICD-10-CM | POA: Diagnosis not present

## 2021-04-14 MED ORDER — LISINOPRIL 10 MG PO TABS: 10.0000 mg | ORAL_TABLET | Freq: Every day | ORAL | 1 refills | Status: DC

## 2021-04-14 MED ORDER — LOVASTATIN 40 MG PO TABS: 40.0000 mg | ORAL_TABLET | Freq: Every day | ORAL | 1 refills | Status: DC

## 2021-04-14 MED ORDER — LEVOTHYROXINE SODIUM 100 MCG PO TABS: 100.0000 ug | ORAL_TABLET | Freq: Every day | ORAL | 1 refills | Status: DC

## 2021-04-14 NOTE — Progress Notes (Signed)
I,April Miller,acting as a scribe for Lelon Huh, MD.,have documented all relevant documentation on the behalf of Lelon Huh, MD,as directed by  Lelon Huh, MD while in the presence of Lelon Huh, MD.    Established patient visit   Patient: Barbara Harper   DOB: 18-Feb-1961   61 y.o. Female  MRN: [id number] Visit Date: 04/14/2021  Today's healthcare provider: Lelon Huh, MD   Chief Complaint  Patient presents with   Follow-up   Hypertension   Subjective    HPI  Diabetes Mellitus Type II, follow-up  Lab Results  Component Value Date   HGBA1C 5.8 (A) 06/28/2020   HGBA1C 5.9 (H) 02/01/2020   HGBA1C 5.6 08/03/2019   Last seen for diabetes 9 months ago.  Management since then includes; Doing very well on current medications. Continue current medications.  Encouraged continue diabetic conscious diet and regular exercise. She reports fair compliance with treatment. She has been in [place] caring for her ill mother off and on for several months.  She is not having side effects. none  Home blood sugar records: fasting range: not checking  Episodes of hypoglycemia? No none   Current insulin regiment: n/a Most Recent Eye Exam: 09/17/2020  ----------------------------------------------------------------------------  Hypertension, follow-up  BP Readings from Last 3 Encounters:  04/14/21 (!) 153/80  06/28/20 130/80  02/01/20 98/64   Wt Readings from Last 3 Encounters:  04/14/21 133 lb (60.3 kg)  06/28/20 127 lb 6.4 oz (57.8 kg)  02/01/20 122 lb 9.6 oz (55.6 kg)     She was last seen for hypertension 9 months ago.  BP at that visit was 130/80. Management since that visit includes continuing current medications.  She reports fair compliance with treatment, but she did run out of her medications last week.  She is not having side effects. none She is not exercising. She is not adherent to low salt diet.   Outside blood pressures are 130/80.  She does not  smoke.  Use of agents associated with hypertension: none.   ----------------------------------------------------------------------------  Lipid/Cholesterol, follow-up  Last Lipid Panel: Lab Results  Component Value Date   CHOL 175 02/01/2020   LDLCALC 96 02/01/2020   HDL 53 02/01/2020   TRIG 150 (H) 02/01/2020    She was last seen for this 02/01/2020.  Management since that visit includes continuing lovastatin.  She reports good compliance with treatment, but she did run out of her medications last week.  She is not having side effects.     The 10-year ASCVD risk score Mikey Bussing DC Brooke Bonito., et al., 2013) is: 12.2%  ----------------------------------------------------------------------------       Medications: Outpatient Medications Prior to Visit  Medication Sig   aspirin 81 MG tablet Take 81 mg by mouth daily.   Blood Glucose Monitoring Suppl (ONE TOUCH ULTRA 2) w/Device KIT Use to check sugar daily   CALCIUM PO Take by mouth.   clotrimazole-betamethasone (LOTRISONE) cream Apply 1 application topically 2 (two) times daily.   cyclobenzaprine (FLEXERIL) 5 MG tablet Take 1 tablet (5 mg total) by mouth 3 (three) times daily as needed for muscle spasms.   EUTHYROX 100 MCG tablet Take 1 tablet by mouth once daily   FLUoxetine (PROZAC) 40 MG capsule Take 1 capsule by mouth once daily   gabapentin (NEURONTIN) 300 MG capsule TAKE ONE CAPSULE BY MOUTH AT BEDTIME   glucose blood (ONE TOUCH ULTRA TEST) test strip Check sugar daily   IRON PO Take by mouth.   Lancets MISC Use  Once Daily   lisinopril (ZESTRIL) 10 MG tablet Take 1 tablet by mouth once daily   lovastatin (MEVACOR) 40 MG tablet Take 1 tablet by mouth once daily   metFORMIN (GLUCOPHAGE) 1000 MG tablet TAKE 1 TABLET BY MOUTH TWICE DAILY WITH MEALS   Multiple Vitamins-Minerals (MULTIVITAMIN ADULT PO) Take 1 tablet by mouth daily.   OZEMPIC, 1 MG/DOSE, 4 MG/3ML SOPN INJECT 1MG INTO THE SKIN ONCE WEEKLY   vitamin B-12  (CYANOCOBALAMIN) 1000 MCG tablet Take 1 tablet by mouth daily.   zolpidem (AMBIEN) 10 MG tablet Take 1 tablet (10 mg total) by mouth at bedtime as needed for sleep.   No facility-administered medications prior to visit.    Review of Systems  Constitutional:  Negative for appetite change, chills, fatigue and fever.  Respiratory:  Negative for chest tightness and shortness of breath.   Cardiovascular:  Negative for chest pain and palpitations.  Gastrointestinal:  Negative for abdominal pain, nausea and vomiting.  Neurological:  Negative for dizziness and weakness.      Objective    BP (!) 153/80 (BP Location: Left Arm, Patient Position: Sitting, Cuff Size: Normal)   Pulse 65   Temp 97.9 F (36.6 C) (Temporal)   Resp 16   Ht 5' 3"  (1.6 m)   Wt 133 lb (60.3 kg)   LMP 01/10/2015   SpO2 97%   BMI 23.56 kg/m     Physical Exam  General appearance: Well developed, well nourished female, cooperative and in no acute distress Head: Normocephalic, without obvious abnormality, atraumatic Respiratory: Respirations even and unlabored, normal respiratory rate Extremities: All extremities are intact.  Skin: Skin color, texture, turgor normal. No rashes seen  Psych: Appropriate mood and affect. Neurologic: Mental status: Alert, oriented to person, place, and time, thought content appropriate.     Assessment & Plan     1. Type 2 diabetes mellitus with hyperlipidemia ([place]) Doing very well on current medications.  - Hemoglobin A1c  2. Primary hypertension Usually well controlled, but she has been out of lisinopril for the last week.   Refill  lisinopril (ZESTRIL) 10 MG tablet; Take 1 tablet (10 mg total) by mouth daily.  Dispense: 90 tablet; Refill: 1  3. Hyperlipidemia, unspecified hyperlipidemia type She is tolerating lovastatin well with no adverse effects.  Out of medications for the last few weeks. Refill lovastatin (MEVACOR) 40 MG tablet; Take 1 tablet (40 mg total) by mouth  daily.  Dispense: 90 tablet; Refill: 1   She will go to lab fasting in about two weeks after starting back on statin - Lipid panel - CBC - Comprehensive metabolic panel  4. Adult hypothyroidism  - TSH - levothyroxine (EUTHYROX) 100 MCG tablet; Take 1 tablet (100 mcg total) by mouth daily.  Dispense: 90 tablet; Refill: 1      The entirety of the information documented in the History of Present Illness, Review of Systems and Physical Exam were personally obtained by me. Portions of this information were initially documented by the CMA and reviewed by me for thoroughness and accuracy.     Lelon Huh, MD  [facility] [phone] (phone) [phone] (fax)  [place]

## 2021-04-14 NOTE — Patient Instructions (Addendum)
Please review the attached list of medications and notify my office if there are any errors.   Please bring all of your medications to every appointment so we can make sure that our medication list is the same as yours.   Please call the [facility] at [phone] to schedule your mammogram.  Please go to the lab draw station in [address] on the second floor of [facility]  when you are fasting for 8 hours. Normal hours are 8:00am to 11:30am and 1:00pm to 4:00pm Monday through Friday

## 2021-04-22 ENCOUNTER — Other Ambulatory Visit: Payer: Self-pay | Admitting: Family Medicine

## 2021-04-22 NOTE — Telephone Encounter (Signed)
Requested medication (s) are due for refill today: yes  Requested medication (s) are on the active medication list: yes  Last refill:  03/25/21 #30  Future visit scheduled: no  Notes to clinic:  Called pt and she stated she had office visit already. Advised pt issue was not addressed. Advised pt will forward to PCP to review.   Requested Prescriptions  Pending Prescriptions Disp Refills   zolpidem (AMBIEN) 10 MG tablet [Pharmacy Med Name: Zolpidem Tartrate 10 MG Oral Tablet] 30 tablet 0    Sig: TAKE 1 TABLET BY MOUTH AT BEDTIME AS NEEDED FOR SLEEP **NEED TO SCHEDULE OFFICE VISIT FOR FOLLOW UP**      Not Delegated - Psychiatry:  Anxiolytics/Hypnotics Failed - 04/22/2021 12:51 PM      Failed - This refill cannot be delegated      Failed - Urine Drug Screen completed in last 360 days      Passed - Valid encounter within last 6 months    Recent Outpatient Visits           1 week ago Type 2 diabetes mellitus with hyperlipidemia [facility])   [facility] [name], MD   9 months ago Type 2 diabetes mellitus with hyperlipidemia [facility])   [facility] [name], MD   1 year ago Type 2 diabetes mellitus with hyperlipidemia [facility])   [facility] [name], MD   1 year ago Type 2 diabetes mellitus with hyperlipidemia [facility])   [facility] [name], MD   2 years ago Type 2 diabetes mellitus with hyperlipidemia [facility])   [facility] Caryn Section, [name], MD

## 2021-04-24 ENCOUNTER — Other Ambulatory Visit: Payer: Self-pay | Admitting: Family Medicine

## 2021-04-24 NOTE — Telephone Encounter (Signed)
Pt called and asked if her Rx for Ambien can be sent to pharmacy today / she had her last appt for a follow up on 7.29.22/ please advise asap  [place], [place] - [place]  [address] [place] [address]  Phone:  [phone]  Fax:  [phone]

## 2021-04-24 NOTE — Telephone Encounter (Signed)
Patient called in to inform Dr Caryn Section that she is in need of her Ambien today please

## 2021-06-18 ENCOUNTER — Other Ambulatory Visit: Payer: Self-pay | Admitting: Family Medicine

## 2021-06-18 NOTE — Telephone Encounter (Signed)
Requested medication (s) are due for refill today: yes  Requested medication (s) are on the active medication list: yes  Last refill:  05/06/20  Future visit scheduled: no  Notes to clinic:  pt did not get blood work in July 22   Requested Prescriptions  Pending Prescriptions Disp Refills   OZEMPIC, 1 MG/DOSE, 4 MG/3ML SOPN [Pharmacy Med Name: Ozempic (1 MG/DOSE) 4 MG/3ML Subcutaneous Solution Pen-injector] 9 mL 0    Sig: INJECT 1MG  INTO THE SKIN ONCE WEEKLY     Endocrinology:  Diabetes - GLP-1 Receptor Agonists Failed - 06/18/2021 11:51 AM      Failed - HBA1C is between 0 and 7.9 and within 180 days    Hemoglobin A1C  Date Value Ref Range Status  06/28/2020 5.8 (A) 4.0 - 5.6 % Final  01/05/2015 7.2  Final   Hgb A1c MFr Bld  Date Value Ref Range Status  02/01/2020 5.9 (H) 4.8 - 5.6 % Final    Comment:             Prediabetes: 5.7 - 6.4          Diabetes: >6.4          Glycemic control for adults with diabetes: <7.0           Passed - Valid encounter within last 6 months    Recent Outpatient Visits           2 months ago Type 2 diabetes mellitus with hyperlipidemia [facility])   [facility] [name], MD   11 months ago Type 2 diabetes mellitus with hyperlipidemia [facility])   [facility] [name], MD   1 year ago Type 2 diabetes mellitus with hyperlipidemia [facility])   [facility] [name], MD   1 year ago Type 2 diabetes mellitus with hyperlipidemia [facility])   [facility] [name], MD   2 years ago Type 2 diabetes mellitus with hyperlipidemia [facility])   [facility] Caryn Section, [name], MD

## 2021-06-19 ENCOUNTER — Other Ambulatory Visit: Payer: Self-pay | Admitting: Family Medicine

## 2021-06-19 NOTE — Telephone Encounter (Signed)
I called and advised patient that we need the labs completed that were ordered back in July 2022. Patient states she was out of town for 1 month at [employer]. She planned on having blood work done tomorrow morning. Patient states that [place] beach, she didn't take the Ozempic for 2 weeks. Patient has resumed taking the Ozempic since she has been back home. Patient wants to wait until next week to have blood work done so that the medication can have time to work. She states she does not need a refill right now on Ozempic since she didn't take it for 2 weeks when she was at the beach.

## 2021-06-20 ENCOUNTER — Other Ambulatory Visit: Payer: Self-pay | Admitting: Family Medicine

## 2021-06-27 ENCOUNTER — Telehealth: Payer: Self-pay | Admitting: Family Medicine

## 2021-06-27 ENCOUNTER — Other Ambulatory Visit: Payer: Self-pay

## 2021-06-27 MED ORDER — OZEMPIC (1 MG/DOSE) 4 MG/3ML ~~LOC~~ SOPN: PEN_INJECTOR | SUBCUTANEOUS | 3 refills | Status: DC

## 2021-06-27 NOTE — Telephone Encounter (Signed)
[facility] faxed refill request for the following medications:   OZEMPIC, 1 MG/DOSE, 4 MG/3ML SOPN   Please advise.

## 2021-07-03 ENCOUNTER — Telehealth: Payer: Self-pay

## 2021-07-03 NOTE — Telephone Encounter (Signed)
Copied from [place] [id number]. Topic: General - Other >> Jul 03, 2021  1:21 PM Tessa Lerner A wrote: Reason for CRM: Verdis Frederickson with Cover[employer]eds needs assistance confirming patient's prescription insurance information as well as coordination a prior authorization for their Semaglutide, 1 MG/DOSE, (OZEMPIC, 1 MG/DOSE,) 4 MG/3ML SOPN [[id number]]  prescription   Please contact further when possible

## 2021-07-04 ENCOUNTER — Telehealth: Payer: Self-pay

## 2021-07-04 NOTE — Telephone Encounter (Signed)
Copied from [place] [id number]. Topic: General - Other >> Jul 04, 2021  4:17 PM Pawlus, Brayton Layman A wrote: Reason for CRM: Pt requested a copy of her latest lab results be printed out, pt stated she will come tomorrow to pick them up.

## 2021-07-05 DIAGNOSIS — I10 Essential (primary) hypertension: Secondary | ICD-10-CM | POA: Diagnosis not present

## 2021-07-05 DIAGNOSIS — E1169 Type 2 diabetes mellitus with other specified complication: Secondary | ICD-10-CM | POA: Diagnosis not present

## 2021-07-05 DIAGNOSIS — E039 Hypothyroidism, unspecified: Secondary | ICD-10-CM | POA: Diagnosis not present

## 2021-07-05 DIAGNOSIS — E785 Hyperlipidemia, unspecified: Secondary | ICD-10-CM | POA: Diagnosis not present

## 2021-07-06 LAB — HEMOGLOBIN A1C
Est. average glucose Bld gHb Est-mCnc: 117 mg/dL
Hgb A1c MFr Bld: 5.7 % — ABNORMAL HIGH (ref 4.8–5.6)

## 2021-07-06 LAB — LIPID PANEL
Chol/HDL Ratio: 3.7 ratio (ref 0.0–4.4)
Cholesterol, Total: 237 mg/dL — ABNORMAL HIGH (ref 100–199)
HDL: 64 mg/dL (ref >39)
LDL Chol Calc (NIH): 147 mg/dL — ABNORMAL HIGH (ref 0–99)
Triglycerides: 144 mg/dL (ref 0–149)
VLDL Cholesterol Cal: 26 mg/dL (ref 5–40)

## 2021-07-06 LAB — CBC
Hematocrit: 40 % (ref 34.0–46.6)
Hemoglobin: 13.9 g/dL (ref 11.1–15.9)
MCH: 32.5 pg (ref 26.6–33.0)
MCHC: 34.8 g/dL (ref 31.5–35.7)
MCV: 94 fL (ref 79–97)
Platelets: 408 10*3/uL (ref 150–450)
RBC: 4.28 x10E6/uL (ref 3.77–5.28)
RDW: 12.2 % (ref 11.7–15.4)
WBC: 6.5 10*3/uL (ref 3.4–10.8)

## 2021-07-06 LAB — COMPREHENSIVE METABOLIC PANEL
ALT: 16 IU/L (ref 0–32)
AST: 20 IU/L (ref 0–40)
Albumin/Globulin Ratio: 1.9 (ref 1.2–2.2)
Albumin: 4.4 g/dL (ref 3.8–4.8)
Alkaline Phosphatase: 88 IU/L (ref 44–121)
BUN/Creatinine Ratio: 10 — ABNORMAL LOW (ref 12–28)
BUN: 9 mg/dL (ref 8–27)
Bilirubin Total: 0.3 mg/dL (ref 0.0–1.2)
CO2: 25 mmol/L (ref 20–29)
Calcium: 9.5 mg/dL (ref 8.7–10.3)
Chloride: 104 mmol/L (ref 96–106)
Creatinine, Ser: 0.89 mg/dL (ref 0.57–1.00)
Globulin, Total: 2.3 g/dL (ref 1.5–4.5)
Glucose: 100 mg/dL — ABNORMAL HIGH (ref 70–99)
Potassium: 4.5 mmol/L (ref 3.5–5.2)
Sodium: 144 mmol/L (ref 134–144)
Total Protein: 6.7 g/dL (ref 6.0–8.5)
eGFR: 74 mL/min/{1.73_m2} (ref >59)

## 2021-07-06 LAB — TSH: TSH: 2.23 u[IU]/mL (ref 0.450–4.500)

## 2021-07-07 ENCOUNTER — Telehealth: Payer: Self-pay

## 2021-07-07 NOTE — Telephone Encounter (Signed)
Please advise 

## 2021-07-07 NOTE — Telephone Encounter (Signed)
Ok, please continue same dose of lovastatin. Will recheck lipids and next follow up. Please schedule diabetes and lipid follow up in 5-6 months.

## 2021-07-07 NOTE — Telephone Encounter (Signed)
Pt advised.  She states she has not been taking lovastatin regularly until about two weeks ago.    Thanks,   -Mickel Baas

## 2021-07-07 NOTE — Telephone Encounter (Signed)
-----   Message from [name], MD sent at 07/06/2021  7:21 AM EDT ----- A1c is good at 5.7%. cholesterol is too high at 237. Please verify if she has been taking lovastatin consistently.

## 2021-07-07 NOTE — Telephone Encounter (Signed)
Pt called back and stated she has not been taking her lovastatin and didn't realize this/ pt stated she hasnt taken it for probably a month /please advise

## 2021-07-07 NOTE — Telephone Encounter (Signed)
Does she have some or does she need a prescription sent to the pharmacy.

## 2021-07-18 ENCOUNTER — Other Ambulatory Visit: Payer: Self-pay | Admitting: Family Medicine

## 2021-10-06 ENCOUNTER — Other Ambulatory Visit: Payer: Self-pay | Admitting: Family Medicine

## 2021-10-09 ENCOUNTER — Other Ambulatory Visit: Payer: Self-pay | Admitting: Family Medicine

## 2021-10-09 ENCOUNTER — Telehealth: Payer: Self-pay | Admitting: Family Medicine

## 2021-10-09 DIAGNOSIS — E039 Hypothyroidism, unspecified: Secondary | ICD-10-CM

## 2021-10-09 DIAGNOSIS — F5101 Primary insomnia: Secondary | ICD-10-CM

## 2021-10-09 MED ORDER — LEVOTHYROXINE SODIUM 100 MCG PO TABS: 100.0000 ug | ORAL_TABLET | Freq: Every day | ORAL | 1 refills | Status: DC

## 2021-10-09 NOTE — Telephone Encounter (Signed)
Requested medication (s) are due for refill today:Yes  Requested medication (s) are on the active medication list: Yes  Last refill:  10/09/21 and 10/06/21  Future visit scheduled: Yes  Notes to clinic:  PATIENT NEEDS MEDICATION SENT TO PHARMACY IN [place], TAKING CARE OF MOM   Requested Prescriptions  Pending Prescriptions Disp Refills   levothyroxine (EUTHYROX) 100 MCG tablet 90 tablet 1    Sig: Take 1 tablet (100 mcg total) by mouth daily.     Endocrinology:  Hypothyroid Agents Failed - 10/09/2021  2:05 PM      Failed - TSH needs to be rechecked within 3 months after an abnormal result. Refill until TSH is due.      Passed - TSH in normal range and within 360 days    TSH  Date Value Ref Range Status  07/05/2021 2.230 0.450 - 4.500 uIU/mL Final          Passed - Valid encounter within last 12 months    Recent Outpatient Visits           5 months ago Type 2 diabetes mellitus with hyperlipidemia [facility])   [facility] [name], MD   1 year ago Type 2 diabetes mellitus with hyperlipidemia [facility])   [facility] [name], MD   1 year ago Type 2 diabetes mellitus with hyperlipidemia [facility])   [facility] [name], MD   2 years ago Type 2 diabetes mellitus with hyperlipidemia [facility])   [facility] [name], MD   2 years ago Type 2 diabetes mellitus with hyperlipidemia [facility])   [place], MD       Future Appointments             In 1 month [name], [name], MD [facility]             zolpidem (AMBIEN) 10 MG tablet 30 tablet 3    Sig: Take 1 tablet (10 mg total) by mouth at bedtime as needed. for sleep     Not Delegated - Psychiatry:  Anxiolytics/Hypnotics Failed - 10/09/2021  2:05 PM      Failed - This refill cannot be delegated      Failed - Urine Drug Screen completed in last 360 days      Passed - Valid encounter within last 6  months    Recent Outpatient Visits           5 months ago Type 2 diabetes mellitus with hyperlipidemia [facility])   [facility] [name], MD   1 year ago Type 2 diabetes mellitus with hyperlipidemia [facility])   [facility] [name], MD   1 year ago Type 2 diabetes mellitus with hyperlipidemia [facility])   [facility] [name], MD   2 years ago Type 2 diabetes mellitus with hyperlipidemia [facility])   [facility] [name], MD   2 years ago Type 2 diabetes mellitus with hyperlipidemia [facility])   [facility] [name], MD       Future Appointments             In 1 month [name], [name], MD [facility] [place]             b

## 2021-10-09 NOTE — Addendum Note (Signed)
Addended by: Julieta Bellini on: 10/09/2021 09:57 AM   Modules accepted: Orders

## 2021-10-09 NOTE — Telephone Encounter (Signed)
Pt has refills at the [facility] in [place].  But she had to leave to take care of Mom, and needs those resent to [facility].  Ambien and levothyroxine.

## 2021-10-09 NOTE — Telephone Encounter (Signed)
[facility] faxed refill request for the following medications:  levothyroxine (EUTHYROX) 100 MCG tablet   Please advise.

## 2021-10-09 NOTE — Telephone Encounter (Signed)
Medication Refill - Medication: levothyroxine (EUTHYROX) 100 MCG ,  tabletzolpidem (AMBIEN) 10 MG tablet    Has the patient contacted their pharmacy? Yes.   (Agent: If no, request that the patient contact the pharmacy for the refill. If patient does not wish to contact the pharmacy document the reason why and proceed with request.) (Agent: If yes, when and what did the pharmacy advise?)  Preferred Pharmacy (with phone number or street name):  [facility] [address] [place] - [phone] [place] Phone:  [phone]  Fax:  [phone]     Has the patient been seen for an appointment in the last year OR does the patient have an upcoming appointment? Yes.    Agent: Please be advised that RX refills may take up to 3 business days. We ask that you follow-up with your pharmacy.    PATIENT NEEDS MEDICATION SENT TO PHARMACY IN [place], TAKING CARE OF MOM

## 2021-10-10 MED ORDER — ZOLPIDEM TARTRATE 10 MG PO TABS: 10.0000 mg | ORAL_TABLET | Freq: Every evening | ORAL | 2 refills | Status: DC | PRN

## 2021-10-10 MED ORDER — LEVOTHYROXINE SODIUM 100 MCG PO TABS: 100.0000 ug | ORAL_TABLET | Freq: Every day | ORAL | 1 refills | Status: DC

## 2021-11-07 ENCOUNTER — Other Ambulatory Visit: Payer: Self-pay | Admitting: Family Medicine

## 2021-11-07 DIAGNOSIS — E785 Hyperlipidemia, unspecified: Secondary | ICD-10-CM

## 2021-11-07 DIAGNOSIS — I10 Essential (primary) hypertension: Secondary | ICD-10-CM

## 2021-12-05 NOTE — Progress Notes (Signed)
?  ? ?I,Roshena L Chambers,acting as a scribe for Lelon Huh, MD.,have documented all relevant documentation on the behalf of Lelon Huh, MD,as directed by  Lelon Huh, MD while in the presence of Lelon Huh, MD.  ? ? ?Established patient visit ? ? ?Patient: Barbara Harper   DOB: 01-05-1961   61 y.o. Female  MRN: [id number] ?Visit Date: 12/06/2021 ? ?Today's healthcare provider: Lelon Huh, MD  ? ?Chief Complaint  ?Patient presents with  ? Diabetes  ? Hyperlipidemia  ? Hypertension  ? ?Subjective  ?  ?HPI  ?Diabetes Mellitus Type II, Follow-up ? ?Lab Results  ?Component Value Date  ? HGBA1C 5.9 (A) 12/06/2021  ? HGBA1C 5.7 (H) 07/05/2021  ? HGBA1C 5.8 (A) 06/28/2020  ? ?Wt Readings from Last 3 Encounters:  ?12/06/21 129 lb (58.5 kg)  ?04/14/21 133 lb (60.3 kg)  ?06/28/20 127 lb 6.4 oz (57.8 kg)  ? ?Last seen for diabetes 7 months ago.  ?Management since then includes continuing same medication. ?She reports poor compliance with treatment. Patient stopped taking Metformin due to concerns of it causing kidney damage.  ?She is not having side effects.  ?Symptoms: ?No fatigue No foot ulcerations  ?No appetite changes No nausea  ?No paresthesia of the feet  No polydipsia  ?No polyuria No visual disturbances   ?No vomiting   ? ? ?Home blood sugar records:  blood sugars are not checked ? ?Episodes of hypoglycemia? No  ?  ?Current insulin regiment: none ?Most Recent Eye Exam: <1 year ago ?Current exercise: none ?Current diet habits: well balanced ? ? ?---------------------------------------------------------------------------------------------------  ? ?Hypertension, follow-up ? ?BP Readings from Last 3 Encounters:  ?12/06/21 105/70  ?04/14/21 (!) 153/80  ?06/28/20 130/80  ? Wt Readings from Last 3 Encounters:  ?12/06/21 129 lb (58.5 kg)  ?04/14/21 133 lb (60.3 kg)  ?06/28/20 127 lb 6.4 oz (57.8 kg)  ?  ? ?She was last seen for hypertension 7 months ago.  ?BP at that visit was 153/80. Management since that  visit includes continuing same medication. ? ?She reports good compliance with treatment. ?She is not having side effects.  ?She is following a Regular diet. ?She is not exercising. ?She does not smoke. ? ?Use of agents associated with hypertension: thyroid hormones.  ? ?Outside blood pressures are not checked. ?Symptoms: ?No chest pain No chest pressure  ?No palpitations No syncope  ?No dyspnea No orthopnea  ?No paroxysmal nocturnal dyspnea No lower extremity edema  ? ? ?The 10-year ASCVD risk score (Arnett DK, et al., 2019) is: 6.5%  ? ?---------------------------------------------------------------------------------------------------  ? ?Lipid/Cholesterol, Follow-up ? ?Last lipid panel Other pertinent labs  ?Lab Results  ?Component Value Date  ? CHOL 237 (H) 07/05/2021  ? HDL 64 07/05/2021  ? LDLCALC 147 (H) 07/05/2021  ? TRIG 144 07/05/2021  ? CHOLHDL 3.7 07/05/2021  ? Lab Results  ?Component Value Date  ? ALT 16 07/05/2021  ? AST 20 07/05/2021  ? PLT 408 07/05/2021  ? TSH 2.230 07/05/2021  ?  ? ?She was last seen for this 7 months ago.  ?Management since that visit includes continuing same medication. ? ?She reports good compliance with treatment. ?She is not having side effects.  ? ?Symptoms: ?No chest pain No chest pressure/discomfort  ?No dyspnea No lower extremity edema  ?No numbness or tingling of extremity No orthopnea  ?No palpitations No paroxysmal nocturnal dyspnea  ?No speech difficulty No syncope  ? ?Current diet: well balanced ?Current exercise: none ? ? ? ?---------------------------------------------------------------------------------------------------  ? ?  Medications: ?Outpatient Medications Prior to Visit  ?Medication Sig  ? aspirin 81 MG tablet Take 81 mg by mouth daily.  ? Blood Glucose Monitoring Suppl (ONE TOUCH ULTRA 2) w/Device KIT Use to check sugar daily  ? CALCIUM PO Take by mouth.  ? clotrimazole-betamethasone (LOTRISONE) cream Apply 1 application topically 2 (two) times daily.  ?  cyclobenzaprine (FLEXERIL) 5 MG tablet Take 1 tablet (5 mg total) by mouth 3 (three) times daily as needed for muscle spasms.  ? FLUoxetine (PROZAC) 40 MG capsule Take 1 capsule by mouth once daily  ? gabapentin (NEURONTIN) 300 MG capsule TAKE ONE CAPSULE BY MOUTH AT BEDTIME  ? glucose blood (ONE TOUCH ULTRA TEST) test strip Check sugar daily  ? IRON PO Take by mouth.  ? Lancets MISC Use Once Daily  ? levothyroxine (EUTHYROX) 100 MCG tablet Take 1 tablet (100 mcg total) by mouth daily.  ? lisinopril (ZESTRIL) 10 MG tablet Take 1 tablet by mouth once daily  ? lovastatin (MEVACOR) 40 MG tablet Take 1 tablet by mouth once daily  ? Multiple Vitamins-Minerals (MULTIVITAMIN ADULT PO) Take 1 tablet by mouth daily.  ? Semaglutide, 1 MG/DOSE, (OZEMPIC, 1 MG/DOSE,) 4 MG/3ML SOPN INJECT 1MG INTO THE SKIN ONCE WEEKLY  ? vitamin B-12 (CYANOCOBALAMIN) 1000 MCG tablet Take 1 tablet by mouth daily.  ? zolpidem (AMBIEN) 10 MG tablet Take 1 tablet (10 mg total) by mouth at bedtime as needed. for sleep  ? metFORMIN (GLUCOPHAGE) 1000 MG tablet TAKE 1 TABLET BY MOUTH TWICE DAILY WITH MEALS (Patient not taking: Reported on 12/06/2021)  ? ?No facility-administered medications prior to visit.  ? ? ?Review of Systems  ?Constitutional:  Negative for appetite change, chills, fatigue and fever.  ?Respiratory:  Negative for chest tightness and shortness of breath.   ?Cardiovascular:  Negative for chest pain and palpitations.  ?Gastrointestinal:  Negative for abdominal pain, nausea and vomiting.  ?Neurological:  Negative for dizziness and weakness.  ? ? ?  Objective  ?  ?BP 105/70 (BP Location: Left Arm, Patient Position: Sitting, Cuff Size: Normal)   Pulse 84   Temp 97.8 ?F (36.6 ?C) (Oral)   Resp 14   Wt 129 lb (58.5 kg)   LMP 01/10/2015   SpO2 100% Comment: room air  BMI 22.85 kg/m?  ? ? ?Physical Exam  ?General appearance: Well developed, well nourished female, cooperative and in no acute distress ?Head: Normocephalic, without obvious  abnormality, atraumatic ?Respiratory: Respirations even and unlabored, normal respiratory rate ?Extremities: All extremities are intact.  ?Skin: Skin color, texture, turgor normal. No rashes seen  ?Psych: Appropriate mood and affect. ?Neurologic: Mental status: Alert, oriented to person, place, and time, thought content appropriate.  ? ?Results for orders placed or performed in visit on 12/06/21  ?POCT HgB A1C  ?Result Value Ref Range  ? Hemoglobin A1C 5.9 (A) 4.0 - 5.6 %  ? Est. average glucose Bld gHb Est-mCnc 123   ? ? Assessment & Plan  ?  ? ?1. Type 2 diabetes mellitus with hyperlipidemia ([place]) ?Very well controlled. Off metformin for 2 weeks. No known adverse reactions, she was just concerned about relationship with kidney disease. She should stay well controlled with Ozempic as monotherapy.  ? ?- Urine Albumin-Creatinine with uACR ? ?2. Hyperlipidemia, unspecified hyperlipidemia type ?Was off statin when last checked, but has since been taking consistently.  ?- Lipid panel ? ?3. Primary insomnia ?Has been taking zolpidem for years but is no longer working very well. She attributes this to  general life stressors. Will reduce zolpidem and try adding  traZODone (DESYREL) 100 MG tablet; Take 0.5-1 tablets (50-100 mg total) by mouth at bedtime.  Dispense: 30 tablet; Refill: 2 ? ?- zolpidem (AMBIEN) 10 MG tablet; Take 0.5 tablets (5 mg total) by mouth at bedtime as needed. for sleep  ? ?Continue SSRI for now.  ? ?Schedule follow up after reviewing labs.   ?   ? ?The entirety of the information documented in the History of Present Illness, Review of Systems and Physical Exam were personally obtained by me. Portions of this information were initially documented by the CMA and reviewed by me for thoroughness and accuracy.   ? ? ?Lelon Huh, MD  ?[facility] ?[phone] (phone) ?[phone] (fax) ? ?[facility]  ?

## 2021-12-06 ENCOUNTER — Other Ambulatory Visit: Payer: Self-pay

## 2021-12-06 ENCOUNTER — Encounter: Payer: Self-pay | Admitting: Family Medicine

## 2021-12-06 ENCOUNTER — Ambulatory Visit (INDEPENDENT_AMBULATORY_CARE_PROVIDER_SITE_OTHER): Payer: BC Managed Care – PPO | Admitting: Family Medicine

## 2021-12-06 VITALS — BP 105/70 | HR 84 | Temp 97.8°F | Resp 14 | Wt 129.0 lb

## 2021-12-06 DIAGNOSIS — F5101 Primary insomnia: Secondary | ICD-10-CM

## 2021-12-06 DIAGNOSIS — E1169 Type 2 diabetes mellitus with other specified complication: Secondary | ICD-10-CM

## 2021-12-06 DIAGNOSIS — E785 Hyperlipidemia, unspecified: Secondary | ICD-10-CM

## 2021-12-06 LAB — POCT GLYCOSYLATED HEMOGLOBIN (HGB A1C)
Est. average glucose Bld gHb Est-mCnc: 123
Hemoglobin A1C: 5.9 % — AB (ref 4.0–5.6)

## 2021-12-06 MED ORDER — TRAZODONE HCL 100 MG PO TABS: 50.0000 mg | ORAL_TABLET | Freq: Every day | ORAL | 2 refills | Status: DC

## 2021-12-06 MED ORDER — ZOLPIDEM TARTRATE 10 MG PO TABS: 5.0000 mg | ORAL_TABLET | Freq: Every evening | ORAL | Status: DC | PRN

## 2021-12-06 MED ORDER — ZOLPIDEM TARTRATE 5 MG PO TABS: 5.0000 mg | ORAL_TABLET | Freq: Every evening | ORAL | Status: DC | PRN

## 2021-12-06 NOTE — Progress Notes (Deleted)
?  ? ? ?  Established patient visit ? ? ?Patient: Barbara Harper   DOB: 1961-04-11   61 y.o. Female  MRN: [id number] ?Visit Date: 12/06/2021 ? ?Today's healthcare provider: Lelon Huh, MD  ? ?Chief Complaint  ?Patient presents with  ? Diabetes  ? Hyperlipidemia  ? Hypertension  ? ?Subjective  ?  ?HPI  ?*** ? ?Medications: ?Outpatient Medications Prior to Visit  ?Medication Sig  ? aspirin 81 MG tablet Take 81 mg by mouth daily.  ? Blood Glucose Monitoring Suppl (ONE TOUCH ULTRA 2) w/Device KIT Use to check sugar daily  ? CALCIUM PO Take by mouth.  ? clotrimazole-betamethasone (LOTRISONE) cream Apply 1 application topically 2 (two) times daily.  ? cyclobenzaprine (FLEXERIL) 5 MG tablet Take 1 tablet (5 mg total) by mouth 3 (three) times daily as needed for muscle spasms.  ? FLUoxetine (PROZAC) 40 MG capsule Take 1 capsule by mouth once daily  ? gabapentin (NEURONTIN) 300 MG capsule TAKE ONE CAPSULE BY MOUTH AT BEDTIME  ? glucose blood (ONE TOUCH ULTRA TEST) test strip Check sugar daily  ? IRON PO Take by mouth.  ? Lancets MISC Use Once Daily  ? levothyroxine (EUTHYROX) 100 MCG tablet Take 1 tablet (100 mcg total) by mouth daily.  ? lisinopril (ZESTRIL) 10 MG tablet Take 1 tablet by mouth once daily  ? lovastatin (MEVACOR) 40 MG tablet Take 1 tablet by mouth once daily  ? Multiple Vitamins-Minerals (MULTIVITAMIN ADULT PO) Take 1 tablet by mouth daily.  ? Semaglutide, 1 MG/DOSE, (OZEMPIC, 1 MG/DOSE,) 4 MG/3ML SOPN INJECT $RemoveBef'1MG'[id number]$  INTO THE SKIN ONCE WEEKLY  ? vitamin B-12 (CYANOCOBALAMIN) 1000 MCG tablet Take 1 tablet by mouth daily.  ? zolpidem (AMBIEN) 10 MG tablet Take 1 tablet (10 mg total) by mouth at bedtime as needed. for sleep  ? metFORMIN (GLUCOPHAGE) 1000 MG tablet TAKE 1 TABLET BY MOUTH TWICE DAILY WITH MEALS (Patient not taking: Reported on 12/06/2021)  ? ?No facility-administered medications prior to visit.  ? ? ? ?  Objective  ?  ?BP 105/70 (BP Location: Left Arm, Patient Position: Sitting, Cuff Size: Normal)    Pulse 84   Temp 97.8 ?F (36.6 ?C) (Oral)   Resp 14   Wt 129 lb (58.5 kg)   LMP 01/10/2015   SpO2 100% Comment: room air  BMI 22.85 kg/m?  ? ? ?Physical Exam  ?General appearance: Well developed, well nourished female, cooperative and in no acute distress ?Head: Normocephalic, without obvious abnormality, atraumatic ?Respiratory: Respirations even and unlabored, normal respiratory rate ?Extremities: All extremities are intact.  ?Skin: Skin color, texture, turgor normal. No rashes seen  ?Psych: Appropriate mood and affect. ?Neurologic: Mental status: Alert, oriented to person, place, and time, thought content appropriate.  ? ?Results for orders placed or performed in visit on 12/06/21  ?POCT HgB A1C  ?Result Value Ref Range  ? Hemoglobin A1C 5.9 (A) 4.0 - 5.6 %  ? Est. average glucose Bld gHb Est-mCnc 123   ? ? Assessment & Plan  ?  ? ?*** ? ?No follow-ups on file.  ?   ? ?{provider attestation***:1} ? ? ?Lelon Huh, MD  ?[facility] ?[phone] (phone) ?[phone] (fax) ? ?[facility] ? ?

## 2021-12-07 LAB — LIPID PANEL
Chol/HDL Ratio: 3.8 ratio (ref 0.0–4.4)
Cholesterol, Total: 172 mg/dL (ref 100–199)
HDL: 45 mg/dL (ref >39)
LDL Chol Calc (NIH): 96 mg/dL (ref 0–99)
Triglycerides: 182 mg/dL — ABNORMAL HIGH (ref 0–149)
VLDL Cholesterol Cal: 31 mg/dL (ref 5–40)

## 2021-12-07 LAB — MICROALBUMIN / CREATININE URINE RATIO
Creatinine, Urine: 154.2 mg/dL
Microalb/Creat Ratio: 8 mg/g creat (ref 0–29)
Microalbumin, Urine: 12.9 ug/mL

## 2022-02-05 ENCOUNTER — Ambulatory Visit: Payer: Self-pay

## 2022-02-05 ENCOUNTER — Telehealth: Payer: Self-pay | Admitting: Family Medicine

## 2022-02-05 ENCOUNTER — Ambulatory Visit: Payer: Self-pay | Admitting: *Deleted

## 2022-02-05 NOTE — Telephone Encounter (Signed)
Pt is requesting that her FLUoxetine (PROZAC) 40 MG capsule be increased to higher MG. She states she is unable to focus and is feeling very sad She would like this sent to [place], [place] - [place]  [address] [place] [address]  Phone:  [phone]  Fax:  [phone]

## 2022-02-05 NOTE — Telephone Encounter (Signed)
Pt. Disconnected call before transfer from [place] agent. Voice mailbox is full, unable to leave a message.

## 2022-02-05 NOTE — Telephone Encounter (Signed)
Chief Complaint: requesting increased dosage for Prozac  Symptoms: increased sadness, wants to cry all the time, does not feel she can get anything done, unable to focus Frequency: all the time Pertinent Negatives: Patient denies suicidal or homicidal ideation Disposition: '[]'$ ED /'[]'$ Urgent Care (no appt availability in office) / '[x]'$ Appointment(In office/virtual)/ '[]'$  [facility] [place] [facility]/ '[]'$ Home Care/ '[]'$ Refused Recommended Disposition /'[]'$ [place] [facility]/ '[]'$  Follow-up with PCP Additional Notes: Patient scheduled virtual appt on  02/06/22 and advised to return call for worsening symptoms.   Pt is requesting that her FLUoxetine (PROZAC) 40 MG capsule be increased to higher MG. She states she is unable to focus and is feeling very sad She would like this sent to [place], [place] [facility]  [address] [place] [address]  Phone:  [phone]  Fax:  [phone]     Reason for Disposition  Symptoms interfere with work or school  Answer Assessment - Initial Assessment Questions 1. CONCERN: "What happened that made you call today?"     Patient requesting Prozac to be increased.  2. DEPRESSION SYMPTOM SCREENING: "How are you feeling overall?" (e.g., decreased energy, increased sleeping or difficulty sleeping, difficulty concentrating, feelings of sadness, guilt, hopelessness, or worthlessness)     Patient states she feels like she wants to cry all the time and does not feel that she can get anything done 3. RISK OF HARM - SUICIDAL IDEATION:  "Do you ever have thoughts of hurting or killing yourself?"  (e.g., yes, no, no but preoccupation with thoughts about death)   - INTENT:  "Do you have thoughts of hurting or killing yourself right NOW?" (e.g., yes, no, N/A)   - PLAN: "Do you have a specific plan for how you would do this?" (e.g., gun, knife, overdose, no plan, N/A)     Patient denies suicidal ideation at this time 4. RISK OF HARM - HOMICIDAL  IDEATION:  "Do you ever have thoughts of hurting or killing someone else?"  (e.g., yes, no, no but preoccupation with thoughts about death)   - INTENT:  "Do you have thoughts of hurting or killing someone right NOW?" (e.g., yes, no, N/A)   - PLAN: "Do you have a specific plan for how you would do this?" (e.g., gun, knife, no plan, N/A)      Patient denies homicidal ideation 5. FUNCTIONAL IMPAIRMENT: "How have things been going for you overall? Have you had more difficulty than usual doing your normal daily activities?"  (e.g., better, same, worse; self-care, school, work, interactions)     Patient states she feels she is unable to get anything done due to not being able to focus 6. SUPPORT: "Who is with you now?" "Who do you live with?" "Do you have family or friends who you can talk to?"      Patient states she can talk to her daughters and fiance 7. THERAPIST: "Do you have a counselor or therapist? Name?"     No 8. STRESSORS: "Has there been any new stress or recent changes in your life?"    Patient denies any stressors at this time 9. ALCOHOL USE OR SUBSTAa/aNCE USE (DRUG USE): "Do you drink alcohol or use any illegal drugs?"     n 10. OTHER: "Do you have any other physical symptoms right now?" (e.g., fever)       No other symptoms reported at this time 11. PREGNANCY: "Is there any chance you are pregnant?" "When was your last menstrual period?"  N/a  Protocols used: Depression-A-AH

## 2022-02-05 NOTE — Telephone Encounter (Signed)
See triage encounter on 02/05/22.

## 2022-02-06 ENCOUNTER — Telehealth (INDEPENDENT_AMBULATORY_CARE_PROVIDER_SITE_OTHER): Payer: BC Managed Care – PPO | Admitting: Family Medicine

## 2022-02-06 DIAGNOSIS — F32A Depression, unspecified: Secondary | ICD-10-CM

## 2022-02-06 MED ORDER — BUPROPION HCL ER (XL) 150 MG PO TB24: 150.0000 mg | ORAL_TABLET | Freq: Every morning | ORAL | 1 refills | Status: DC

## 2022-02-06 NOTE — Progress Notes (Signed)
Virtual telephone visit    Virtual Visit via Telephone Note   This visit type was conducted due to national recommendations for restrictions regarding the COVID-19 Pandemic (e.g. social distancing) in an effort to limit this patient's exposure and mitigate transmission in our community. Due to her co-morbid illnesses, this patient is at least at moderate risk for complications without adequate follow up. This format is felt to be most appropriate for this patient at this time. The patient did not have access to video technology or had technical difficulties with video requiring transitioning to audio format only (telephone). Physical exam was limited to content and character of the telephone converstion.    Patient location: HOME Provider location: [facility]  I discussed the limitations of evaluation and management by telemedicine and the availability of in person appointments. The patient expressed understanding and agreed to proceed.   Visit Date: 02/06/2022  Today's healthcare provider: Lelon Huh, MD   Chief Complaint  Patient presents with   Anxiety    Panic attacks     Subjective    HPI  Patient is requesting increased dosage for Prozac.  Reports she is taking 40 mg daily.  Reports increased sadness and anxiety, wants to cry all the time, does not feel she can get anything done, unable to focus, trouble sleeping.  Denies suicidal ideation at this time. Patient does not have a therapist. Patient does not use alcohol or illegal drugs.  Increased stress at work and unable to concentrate.  Her fiance has stage 4 cancer and mother has had some health problems.    02/06/2022    1:26 PM  GAD 7 : Generalized Anxiety Score  Nervous, Anxious, on Edge 3  Control/stop worrying 1  Worry too much - different things 3  Trouble relaxing 3  Restless 0  Easily annoyed or irritable 0  Afraid - awful might happen 1  Total GAD 7 Score 11  Anxiety Difficulty Somewhat difficult         12/06/2021    8:25 AM 04/14/2021    8:22 AM 02/01/2020    8:36 AM  PHQ9 SCORE ONLY  PHQ-9 Total Score 7 4 0       Medications: Outpatient Medications Prior to Visit  Medication Sig   Blood Glucose Monitoring Suppl (ONE TOUCH ULTRA 2) w/Device KIT Use to check sugar daily   CALCIUM PO Take by mouth.   clotrimazole-betamethasone (LOTRISONE) cream Apply 1 application topically 2 (two) times daily.   cyclobenzaprine (FLEXERIL) 5 MG tablet Take 1 tablet (5 mg total) by mouth 3 (three) times daily as needed for muscle spasms.   FLUoxetine (PROZAC) 40 MG capsule Take 1 capsule by mouth once daily   gabapentin (NEURONTIN) 300 MG capsule TAKE ONE CAPSULE BY MOUTH AT BEDTIME   glucose blood (ONE TOUCH ULTRA TEST) test strip Check sugar daily   IRON PO Take by mouth.   Lancets MISC Use Once Daily   levothyroxine (EUTHYROX) 100 MCG tablet Take 1 tablet (100 mcg total) by mouth daily.   lisinopril (ZESTRIL) 10 MG tablet Take 1 tablet by mouth once daily   lovastatin (MEVACOR) 40 MG tablet Take 1 tablet by mouth once daily   Multiple Vitamins-Minerals (MULTIVITAMIN ADULT PO) Take 1 tablet by mouth daily.   Semaglutide, 1 MG/DOSE, (OZEMPIC, 1 MG/DOSE,) 4 MG/3ML SOPN INJECT $RemoveBef'1MG'FWDnovwGSP$  INTO THE SKIN ONCE WEEKLY   traZODone (DESYREL) 100 MG tablet Take 0.5-1 tablets (50-100 mg total) by mouth at bedtime.   vitamin B-12 (  CYANOCOBALAMIN) 1000 MCG tablet Take 1 tablet by mouth daily.   zolpidem (AMBIEN) 10 MG tablet Take 0.5 tablets (5 mg total) by mouth at bedtime as needed. for sleep   aspirin 81 MG tablet Take 81 mg by mouth daily. (Patient not taking: Reported on 02/06/2022)   No facility-administered medications prior to visit.    Review of Systems     Objective    LMP 01/10/2015    Awake, alert, oriented x 3. In no apparent distress. Voice on phone is somewhat flat and suppressed.    Assessment & Plan     1. Depression, unspecified depression type Had been on fluoxetine for several years  primarily for anxiety, but has been much more depressed lately with crying spells and poor concentration. Will add buPROPion (WELLBUTRIN XL) 150 MG 24 hr tablet; Take 1 tablet (150 mg total) by mouth in the morning.  Dispense: 30 tablet; Refill: 1   Will need to follow up in a few weeks.     I discussed the assessment and treatment plan with the patient. The patient was provided an opportunity to ask questions and all were answered. The patient agreed with the plan and demonstrated an understanding of the instructions.   The patient was advised to call back or seek an in-person evaluation if the symptoms worsen or if the condition fails to improve as anticipated.  I provided 12 minutes of non-face-to-face time during this encounter.  The entirety of the information documented in the History of Present Illness, Review of Systems and Physical Exam were personally obtained by me. Portions of this information were initially documented by the CMA and reviewed by me for thoroughness and accuracy.    Lelon Huh, MD [facility] [phone] (phone) [phone] (fax)  [place]

## 2022-02-14 DIAGNOSIS — E113393 Type 2 diabetes mellitus with moderate nonproliferative diabetic retinopathy without macular edema, bilateral: Secondary | ICD-10-CM | POA: Diagnosis not present

## 2022-02-20 ENCOUNTER — Ambulatory Visit (INDEPENDENT_AMBULATORY_CARE_PROVIDER_SITE_OTHER): Payer: BC Managed Care – PPO | Admitting: Family Medicine

## 2022-02-20 ENCOUNTER — Ambulatory Visit: Payer: Self-pay | Admitting: *Deleted

## 2022-02-20 ENCOUNTER — Encounter: Payer: Self-pay | Admitting: Family Medicine

## 2022-02-20 VITALS — BP 145/76 | HR 73 | Temp 98.3°F | Resp 16 | Wt 126.1 lb

## 2022-02-20 DIAGNOSIS — F32A Depression, unspecified: Secondary | ICD-10-CM

## 2022-02-20 DIAGNOSIS — E039 Hypothyroidism, unspecified: Secondary | ICD-10-CM

## 2022-02-20 DIAGNOSIS — I10 Essential (primary) hypertension: Secondary | ICD-10-CM | POA: Diagnosis not present

## 2022-02-20 DIAGNOSIS — F419 Anxiety disorder, unspecified: Secondary | ICD-10-CM

## 2022-02-20 DIAGNOSIS — F5101 Primary insomnia: Secondary | ICD-10-CM

## 2022-02-20 MED ORDER — ALPRAZOLAM 0.25 MG PO TABS: 0.2500 mg | ORAL_TABLET | Freq: Two times a day (BID) | ORAL | 1 refills | Status: DC | PRN

## 2022-02-20 MED ORDER — ZOLPIDEM TARTRATE 10 MG PO TABS: 5.0000 mg | ORAL_TABLET | Freq: Every evening | ORAL | 1 refills | Status: DC | PRN

## 2022-02-20 MED ORDER — PAROXETINE HCL 40 MG PO TABS: 40.0000 mg | ORAL_TABLET | ORAL | 1 refills | Status: DC

## 2022-02-20 MED ORDER — OZEMPIC (0.25 OR 0.5 MG/DOSE) 2 MG/3ML ~~LOC~~ SOPN: 0.2500 mg | PEN_INJECTOR | SUBCUTANEOUS | 0 refills | Status: DC

## 2022-02-20 NOTE — Progress Notes (Signed)
I,[name],acting as a scribe for Lelon Huh, MD.,have documented all relevant documentation on the behalf of Lelon Huh, MD,as directed by  Lelon Huh, MD while in the presence of Lelon Huh, MD.   Established patient visit   Patient: Barbara Harper   DOB: [birth year]   61 y.o. Female  MRN: [id number] Visit Date: 02/20/2022  Today's healthcare provider: Lelon Huh, MD   No chief complaint on file.  Subjective    Patient is a 61 yr old female presenting for panic attacks.  More frequently.   Anxiety/depression, Follow-up  She was last seen for anxiety with phone visit on 02-05-22. Changes made at last visit include started add Bupropion 150 mg. She reports excellent compliance with treatment. She reports good tolerance of treatment. She is not having side effects.   She feels her anxiety is moderate and Unchanged since last visit. Is also having recurrent abdominal pains, cramping and bowel changes.   Symptoms: No chest pain Yes difficulty concentrating  No dizziness Yes fatigue  No feelings of losing control Yes insomnia  No irritable Yes palpitations  Yes panic attacks No racing thoughts  Yes shortness of breath Yes sweating  Yes tremors/shakes    GAD-7 Results    02/06/2022    1:26 PM  GAD-7 Generalized Anxiety Disorder Screening Tool  1. Feeling Nervous, Anxious, or on Edge 3  2. Not Being Able to Stop or Control Worrying 1  3. Worrying Too Much About Different Things 3  4. Trouble Relaxing 3  5. Being So Restless it's Hard To Sit Still 0  6. Becoming Easily Annoyed or Irritable 0  7. Feeling Afraid As If Something Awful Might Happen 1  Total GAD-7 Score 11  Difficulty At Work, Home, or Getting  Along With Others? Somewhat difficult    PHQ-9 Scores    12/06/2021    8:25 AM 04/14/2021    8:22 AM 02/01/2020    8:36 AM  PHQ9 SCORE ONLY  PHQ-9 Total Score 7 4 0     ---------------------------------------------------------------------------------------------------       Medications Outpatient Medications Prior to Visit  Medication Sig   aspirin 81 MG tablet Take 81 mg by mouth daily.   Blood Glucose Monitoring Suppl (ONE TOUCH ULTRA 2) w/Device KIT Use to check sugar daily   buPROPion (WELLBUTRIN XL) 150 MG 24 hr tablet Take 1 tablet (150 mg total) by mouth in the morning.   CALCIUM PO Take by mouth.   clotrimazole-betamethasone (LOTRISONE) cream Apply 1 application topically 2 (two) times daily.   cyclobenzaprine (FLEXERIL) 5 MG tablet Take 1 tablet (5 mg total) by mouth 3 (three) times daily as needed for muscle spasms.   FLUoxetine (PROZAC) 40 MG capsule Take 1 capsule by mouth once daily   gabapentin (NEURONTIN) 300 MG capsule TAKE ONE CAPSULE BY MOUTH AT BEDTIME   glucose blood (ONE TOUCH ULTRA TEST) test strip Check sugar daily   IRON PO Take by mouth.   Lancets MISC Use Once Daily   levothyroxine (EUTHYROX) 100 MCG tablet Take 1 tablet (100 mcg total) by mouth daily.   lisinopril (ZESTRIL) 10 MG tablet Take 1 tablet by mouth once daily   lovastatin (MEVACOR) 40 MG tablet Take 1 tablet by mouth once daily   Multiple Vitamins-Minerals (MULTIVITAMIN ADULT PO) Take 1 tablet by mouth daily.   Semaglutide, 1 MG/DOSE, (OZEMPIC, 1 MG/DOSE,) 4 MG/3ML SOPN INJECT 1MG INTO THE SKIN ONCE WEEKLY   traZODone (DESYREL) 100 MG tablet Take  0.5-1 tablets (50-100 mg total) by mouth at bedtime.   vitamin B-12 (CYANOCOBALAMIN) 1000 MCG tablet Take 1 tablet by mouth daily.   zolpidem (AMBIEN) 10 MG tablet Take 0.5 tablets (5 mg total) by mouth at bedtime as needed. for sleep   No facility-administered medications prior to visit.    Review of Systems  Constitutional:  Negative for appetite change, chills, fatigue and fever.  Respiratory:  Negative for chest tightness and shortness of breath.   Cardiovascular:  Negative for chest pain and palpitations.   Gastrointestinal:  Negative for abdominal pain, nausea and vomiting.  Neurological:  Negative for dizziness and weakness.       Objective    BP (!) 145/76 (BP Location: Right Arm, Patient Position: Sitting, Cuff Size: Normal)   Pulse 73   Temp 98.3 F (36.8 C) (Oral)   Resp 16   Wt 126 lb 1.6 oz (57.2 kg)   LMP 01/10/2015   SpO2 100%   BMI 22.34 kg/m    Physical Exam   General appearance: Well developed, well nourished female, cooperative and in no acute distress Head: Normocephalic, without obvious abnormality, atraumatic Respiratory: Respirations even and unlabored, normal respiratory rate Extremities: All extremities are intact.  Skin: Skin color, texture, turgor normal. No rashes seen  Psych: Appropriate mood and affect. Neurologic: Mental status: Alert, oriented to person, place, and time, thought content appropriate.   Assessment & Plan   1. Adult hypothyroidism May be contributing anxiety.  - TSH + free T4 - CBC - Comprehensive metabolic panel  2. Primary hypertension Fairly well controlled. Continue current medications.    3. Primary insomnia Trazodone has not been effective. She is going to change back to zolpidem.   4. Depression, unspecified depression type No longer having uncontrolled crying spells with addition of bupropion. Continue   5. Anxiety Change fluoxetine to paroxetine 36m daily which may work better to help anxiety and improve sleep. Can also take prn- ALPRAZolam (XANAX) 0.25 MG tablet; Take 1 tablet (0.25 mg total) by mouth 2 (two) times daily as needed for anxiety.  Dispense: 30 tablet; Refill: 1    She is currently incapacitated by multiple medical conditions as above. She is not able to perform her required work duties and will likely not be able to for several weeks. Will complete FMLA forms for 1 month leave starting on 02-20-2022     The entirety of the information documented in the History of Present Illness, Review of Systems and  Physical Exam were personally obtained by me. Portions of this information were initially documented by the CMA and reviewed by me for thoroughness and accuracy.     DLelon Huh MD  [phone](phone) [phone](fax)  [place]

## 2022-02-20 NOTE — Telephone Encounter (Signed)
    Chief Complaint: Panic Attacks Symptoms: Worsening panic attacks, 2-3 daily. Unable to work "Can't think." States placed on med "I don't think its's working." Can't eat, drink. Urine dark,nauseated at times. Frequency: 2-3 times daily Pertinent Negatives: Patient denies suicidal ideation. Disposition: '[]'$ ED /'[]'$ Urgent Care (no appt availability in office) / '[x]'$ Appointment(In office/virtual)/ '[]'$  [place] [facility]/ '[]'$ Home Care/ '[]'$ Refused Recommended Disposition /'[]'$ [place] [facility]/ '[]'$  Follow-up with PCP Additional Notes: Appt secured with Dr.[name] today. Care advise provided. Reason for Disposition  Symptoms interfere with work or school  Answer Assessment - Initial Assessment Questions 1. CONCERN: "Did anything happen that prompted you to call today?"      No. Just no better 2. ANXIETY SYMPTOMS: "Can you describe how you (your loved one; patient) have been feeling?" (e.g., tense, restless, panicky, anxious, keyed up, overwhelmed, sense of impending doom).      Panic attacks 3. ONSET: "How long have you been feeling this way?" (e.g., hours, days, weeks)     Saw 4. SEVERITY: "How would you rate the level of anxiety?" (e.g., 0 - 10; or mild, moderate, severe).     Varies, sometimes can calm down 5. FUNCTIONAL IMPAIRMENT: "How have these feelings affected your ability to do daily activities?" "Have you had more difficulty than usual doing your normal daily activities?" (e.g., getting better, same, worse; self-care, school, work, interactions)     CAn't think, can't work. 6. HISTORY: "Have you felt this way before?" "Have you ever been diagnosed with an anxiety problem in the past?" (e.g., generalized anxiety disorder, panic attacks, PTSD). If Yes, ask: "How was this problem treated?" (e.g., medicines, counseling, etc.)     Having several times a day 7. RISK OF HARM - SUICIDAL IDEATION: "Do you ever have thoughts of hurting or killing yourself?" If Yes, ask:  "Do you have these  feelings now?" "Do you have a plan on how you would do this?"     no 8. TREATMENT:  "What has been done so far to treat this anxiety?" (e.g., medicines, relaxation strategies). "What has helped?"     Meds 9. TREATMENT - THERAPIST: "Do you have a counselor or therapist? Name?"     No 10. POTENTIAL TRIGGERS: "Do you drink caffeinated beverages (e.g., coffee, colas, teas), and how much daily?" "Do you drink alcohol or use any drugs?" "Have you started any new medicines recently?"     No 10. PATIENT SUPPORT: "Who is with you now?" "Who do you live with?" "Do you have family or friends who you can talk to?"        Yes 85. OTHER SYMPTOMS: "Do you have any other symptoms?" (e.g., feeling depressed, trouble concentrating, trouble sleeping, trouble breathing, palpitations or fast heartbeat, chest pain, sweating, nausea, or diarrhea)       Dehydrated, urine dark. Not eating  Protocols used: Anxiety and Panic Attack-A-AH

## 2022-02-20 NOTE — Telephone Encounter (Signed)
Please watch for fax coming from patient    Copied from [place]. Topic: General - Other >> Feb 20, 2022 10:05 AM Erick Blinks wrote: Reason for CRM: Pt called to request that the office responds to her fax as soon as possible. Since she spoke with her PCP and started her anti-depressants she has had panic attacks and has a document that requires immediate correspondence for her place of work. Or she will lose her job.   Best contact: [phone]

## 2022-02-20 NOTE — Patient Instructions (Addendum)
Please review the attached list of medications and notify my office if there are any errors.   Start taking paroxetine '40mg'$  once a day IN PLACE OF fluoxetine '40mg'$    Reduce Ozempic to 0.'25mg'$  once a week until your stomach feels better, then go up to 0.'5mg'$  once a week.

## 2022-02-21 ENCOUNTER — Telehealth: Payer: Self-pay | Admitting: Family Medicine

## 2022-02-21 ENCOUNTER — Telehealth: Payer: Self-pay

## 2022-02-21 DIAGNOSIS — E039 Hypothyroidism, unspecified: Secondary | ICD-10-CM | POA: Diagnosis not present

## 2022-02-21 DIAGNOSIS — F5101 Primary insomnia: Secondary | ICD-10-CM | POA: Diagnosis not present

## 2022-02-21 DIAGNOSIS — R945 Abnormal results of liver function studies: Secondary | ICD-10-CM | POA: Diagnosis not present

## 2022-02-21 DIAGNOSIS — I10 Essential (primary) hypertension: Secondary | ICD-10-CM | POA: Diagnosis not present

## 2022-02-21 NOTE — Telephone Encounter (Signed)
FMLA forms completed and sent to medical records.

## 2022-02-21 NOTE — Telephone Encounter (Signed)
Copied from [place] [phone]. Topic: General - Other >> Feb 21, 2022 10:33 AM McGill, Nelva Bush wrote: Reason for CRM: Pt stated she dropped off paperwork with Dr.[name] and was advised to call back with a secure fax number to send in paperwork to the employer.  Fax: [phone]

## 2022-02-21 NOTE — Telephone Encounter (Signed)
Medication Refill - Medication: zolpidem (AMBIEN) 10 MG tablet   Pt is requesting we reach out to the pharmacy and have them release medication as she is completely out of it.   Has the patient contacted their pharmacy? Yes.   (Agent: If no, request that the patient contact the pharmacy for the refill. If patient does not wish to contact the pharmacy document the reason why and proceed with request.)   Preferred Pharmacy (with phone number or street name):  [place], [place] [facility] [place]  [place] [address]  Phone: [phone] Fax: [phone]  Hours: Not open 24 hours   Has the patient been seen for an appointment in the last year OR does the patient have an upcoming appointment? Yes.    Agent: Please be advised that RX refills may take up to 3 business days. We ask that you follow-up with your pharmacy.

## 2022-02-22 ENCOUNTER — Telehealth: Payer: Self-pay

## 2022-02-22 DIAGNOSIS — R748 Abnormal levels of other serum enzymes: Secondary | ICD-10-CM

## 2022-02-22 LAB — CBC
Hematocrit: 38.5 % (ref 34.0–46.6)
Hemoglobin: 13.3 g/dL (ref 11.1–15.9)
MCH: 31.1 pg (ref 26.6–33.0)
MCHC: 34.5 g/dL (ref 31.5–35.7)
MCV: 90 fL (ref 79–97)
Platelets: 238 10*3/uL (ref 150–450)
RBC: 4.28 x10E6/uL (ref 3.77–5.28)
RDW: 13 % (ref 11.7–15.4)
WBC: 6.1 10*3/uL (ref 3.4–10.8)

## 2022-02-22 LAB — COMPREHENSIVE METABOLIC PANEL
ALT: 239 IU/L — ABNORMAL HIGH (ref 0–32)
AST: 288 IU/L — ABNORMAL HIGH (ref 0–40)
Albumin/Globulin Ratio: 1.5 (ref 1.2–2.2)
Albumin: 3.5 g/dL — ABNORMAL LOW (ref 3.8–4.8)
Alkaline Phosphatase: 235 IU/L — ABNORMAL HIGH (ref 44–121)
BUN/Creatinine Ratio: 11 — ABNORMAL LOW (ref 12–28)
BUN: 11 mg/dL (ref 8–27)
Bilirubin Total: 4 mg/dL — ABNORMAL HIGH (ref 0.0–1.2)
CO2: 20 mmol/L (ref 20–29)
Calcium: 8.7 mg/dL (ref 8.7–10.3)
Chloride: 106 mmol/L (ref 96–106)
Creatinine, Ser: 0.98 mg/dL (ref 0.57–1.00)
Globulin, Total: 2.4 g/dL (ref 1.5–4.5)
Glucose: 246 mg/dL — ABNORMAL HIGH (ref 70–99)
Potassium: 3.9 mmol/L (ref 3.5–5.2)
Sodium: 141 mmol/L (ref 134–144)
Total Protein: 5.9 g/dL — ABNORMAL LOW (ref 6.0–8.5)
eGFR: 66 mL/min/{1.73_m2} (ref >59)

## 2022-02-22 LAB — TSH+FREE T4
Free T4: 3.08 ng/dL — ABNORMAL HIGH (ref 0.82–1.77)
TSH: 0.129 u[IU]/mL — ABNORMAL LOW (ref 0.450–4.500)

## 2022-02-22 NOTE — Telephone Encounter (Signed)
-----   Message from [name], MD sent at 02/22/2022  7:56 AM EDT ----- Labs show she is hyPERthyroid, which could be aggravating her anxiousness. Need to reduced levothyroxine to 3mg once a day. Please send in prescription for #90 and rf x 0.  Liver functions are very high, need to ADD hepatitis A antibody, hepatitis B surface antibody, hepatitis B core antibody, and hepatitis C antibody to labs.  Need to order abdominal ultrasound for evaluation of elevated transaminases.

## 2022-02-22 NOTE — Telephone Encounter (Signed)
That' fine, please advise pharmacy it's ok to fill now .

## 2022-02-22 NOTE — Telephone Encounter (Signed)
Called [facility] and they will fax a form with confirmation of the test that were ADD on or a report stating that they couldn't add the additional testing.  The following test codes were provided to [facility] 163846- Hep A antibody [id number]-Hep B surface antibody [id number]-Hep B Core Antibody 144050-Hep C antibody  Additional ICD10: R94.5 for abnormal results for high liver function.  [place] of abdomen ordered as well.

## 2022-02-22 NOTE — Telephone Encounter (Signed)
Patient pick up prescription on 02/21/2022 per pharmacist

## 2022-02-25 ENCOUNTER — Other Ambulatory Visit: Payer: Self-pay | Admitting: Family Medicine

## 2022-02-25 DIAGNOSIS — E039 Hypothyroidism, unspecified: Secondary | ICD-10-CM

## 2022-02-25 MED ORDER — LEVOTHYROXINE SODIUM 50 MCG PO TABS: 50.0000 ug | ORAL_TABLET | Freq: Every day | ORAL | 1 refills | Status: DC

## 2022-02-28 ENCOUNTER — Ambulatory Visit: Payer: BC Managed Care – PPO | Admitting: Family Medicine

## 2022-03-12 ENCOUNTER — Ambulatory Visit
Admission: RE | Admit: 2022-03-12 | Discharge: 2022-03-12 | Disposition: A | Discharge status: Home / Self Care | Payer: BC Managed Care – PPO | Source: Ambulatory Visit | Attending: Family Medicine | Admitting: Family Medicine

## 2022-03-12 DIAGNOSIS — R748 Abnormal levels of other serum enzymes: Secondary | ICD-10-CM | POA: Insufficient documentation

## 2022-03-12 DIAGNOSIS — R945 Abnormal results of liver function studies: Secondary | ICD-10-CM | POA: Diagnosis not present

## 2022-03-14 ENCOUNTER — Telehealth: Payer: Self-pay

## 2022-03-14 NOTE — Telephone Encounter (Signed)
Pt. Calling to review lab results again. Verbalizes understanding.

## 2022-03-17 LAB — HCV INTERPRETATION

## 2022-03-17 LAB — HCV AB W REFLEX TO QUANT PCR: HCV Ab: NONREACTIVE

## 2022-03-17 LAB — SPECIMEN STATUS REPORT

## 2022-03-17 LAB — HEPATITIS B SURFACE ANTIBODY,QUALITATIVE: Hep B Surface Ab, Qual: NONREACTIVE

## 2022-03-17 LAB — HEPATITIS B CORE ANTIBODY, TOTAL: Hep B Core Total Ab: NEGATIVE

## 2022-03-17 LAB — HEPATITIS A ANTIBODY, IGM: Hep A IgM: NEGATIVE

## 2022-03-18 ENCOUNTER — Other Ambulatory Visit: Payer: Self-pay | Admitting: Family Medicine

## 2022-03-18 DIAGNOSIS — F5101 Primary insomnia: Secondary | ICD-10-CM

## 2022-03-19 NOTE — Telephone Encounter (Signed)
Last refill: 12/06/2021 #30 with 2 refills  Last office visit: 02/20/2022 Next office visit: 03/26/2022

## 2022-03-21 ENCOUNTER — Telehealth: Payer: Self-pay

## 2022-03-21 ENCOUNTER — Other Ambulatory Visit: Payer: Self-pay | Admitting: Family Medicine

## 2022-03-21 DIAGNOSIS — E039 Hypothyroidism, unspecified: Secondary | ICD-10-CM

## 2022-03-21 DIAGNOSIS — R1084 Generalized abdominal pain: Secondary | ICD-10-CM

## 2022-03-21 DIAGNOSIS — R748 Abnormal levels of other serum enzymes: Secondary | ICD-10-CM

## 2022-03-21 NOTE — Telephone Encounter (Signed)
Tried calling patient. Left message to call back. OK for [facility] triage to advise.  ?

## 2022-03-21 NOTE — Telephone Encounter (Signed)
Patient called this morning requesting results from abdominal ultrasound that was done on  03/12/2022. Please review and advise.

## 2022-03-21 NOTE — Telephone Encounter (Signed)
Pt returned call, message read to pt, verbalizes understanding. Pt also states when she picked up Ambien last month ,the '10mg'$  dose as usual, "The pills were cut in half and bottle read take 1/2 tab. Pt states she has been on '10mg'$  , whole tab for years. She is needing refill. Questioning why 1/2 tab was ordered.   Please advise. Thank you.

## 2022-03-21 NOTE — Telephone Encounter (Signed)
Looks like change was made during office visit on 12/06/2021.  From that visit it was noted that : Patient "Has been taking zolpidem for years but is no longer working very well. She attributes this to general life stressors. Will reduce zolpidem and try adding  traZODone (DESYREL) 100 MG tablet; Take 0.5-1 tablets (50-100 mg total) by mouth at bedtime. "  I called and advised patient of when this change was made. Patient didn't make those changes and has been taking 1 full tablet of Ambien '10mg'$  every night along with Trazodone. Patient reports that this regiment has not helped improve insomnia. Patient has an [place] appointment with Dr. Caryn Section on 03/26/2022. I advised patient that she could discuss medication adjustments during that appointment, unless Dr. Caryn Section has new recommendations to try. Please advise.

## 2022-03-21 NOTE — Telephone Encounter (Signed)
Liver ultrasound is basically normal, there is a small hemangioma which is just a collection of small blood vessels. But this would not cause any pain or affect liver functions.  Need to proceed with abominal MRI and additional labs. Have placed order for MRI and labs. She does not need to be fasting for labs.

## 2022-03-21 NOTE — Telephone Encounter (Signed)
Pt called back in to follow up on request for call back for [place] results. Pt says that she has been waiting for her results for days and is getting frustrated because she says that she doesn't understand why she hasn't received a call back yet. Pt says that she is still in pain and is hoping to be advised asap by PCP to avoid going to ED. Pt says that her family is wanting her to go to ED but she doesn't want to    Offered NT to pt, pt had declined. States, "not unless they are giving me my results." Pt acknowledge receiving lab results.   [phone] -

## 2022-03-26 ENCOUNTER — Encounter: Payer: Self-pay | Admitting: Family Medicine

## 2022-03-26 ENCOUNTER — Ambulatory Visit (INDEPENDENT_AMBULATORY_CARE_PROVIDER_SITE_OTHER): Payer: BC Managed Care – PPO | Admitting: Family Medicine

## 2022-03-26 VITALS — BP 127/74 | HR 87 | Temp 97.5°F | Resp 16 | Wt 121.0 lb

## 2022-03-26 DIAGNOSIS — F410 Panic disorder [episodic paroxysmal anxiety] without agoraphobia: Secondary | ICD-10-CM

## 2022-03-26 DIAGNOSIS — R17 Unspecified jaundice: Secondary | ICD-10-CM | POA: Diagnosis not present

## 2022-03-26 DIAGNOSIS — E039 Hypothyroidism, unspecified: Secondary | ICD-10-CM | POA: Diagnosis not present

## 2022-03-26 DIAGNOSIS — R748 Abnormal levels of other serum enzymes: Secondary | ICD-10-CM | POA: Diagnosis not present

## 2022-03-26 DIAGNOSIS — F5101 Primary insomnia: Secondary | ICD-10-CM

## 2022-03-26 MED ORDER — PAROXETINE HCL 40 MG PO TABS
60.0000 mg | ORAL_TABLET | Freq: Every day | ORAL | 2 refills | Status: DC
Start: 2022-03-26 — End: 2022-07-27

## 2022-03-26 MED ORDER — ZOLPIDEM TARTRATE 10 MG PO TABS: 5.0000 mg | ORAL_TABLET | Freq: Every evening | ORAL | Status: DC | PRN

## 2022-03-26 NOTE — Telephone Encounter (Signed)
Barbara Harper in medical records faxed FMLA forms to [employer] [facility] [employer] today during office visit.

## 2022-03-26 NOTE — Progress Notes (Signed)
I,Roshena L Chambers,acting as a scribe for Lelon Huh, MD.,have documented all relevant documentation on the behalf of Lelon Huh, MD,as directed by  Lelon Huh, MD while in the presence of Lelon Huh, MD.    Established patient visit   Patient: Barbara Harper   DOB: 02-06-1961   61 y.o. Female  MRN: [id number] Visit Date: 03/26/2022  Today's healthcare provider: Lelon Huh, MD   Chief Complaint  Patient presents with   Follow-up   Subjective    HPI  Depression, Follow-up  She  was last seen for this 1 months ago. Changes made at last visit include start taking paroxetine 52m once a day IN PLACE OF fluoxetine 460m   She reports good compliance with treatment. She is not having side effects.   She reports good tolerance of treatment. Current symptoms include: depressed mood and panic attacks She states she is still having panic attacks most days, but not as frequent or severe as before medication change. She is still taking alprazolam every day.        03/26/2022    1:55 PM 02/20/2022    3:55 PM 12/06/2021    8:25 AM  Depression screen PHQ 2/9  Decreased Interest 3 2 1   Down, Depressed, Hopeless 3 3 1   PHQ - 2 Score 6 5 2   Altered sleeping 3 3 1   Tired, decreased energy 3 3 1   Change in appetite 2 3 1   Feeling bad or failure about yourself  2 3 1   Trouble concentrating 0 2 1  Moving slowly or fidgety/restless 0 0 0  Suicidal thoughts 0 0 0  PHQ-9 Score 16 19 7   Difficult doing work/chores Very difficult Extremely dIfficult Somewhat difficult    -----------------------------------------------------------------------------------------  She has also  been having generalized abdominal pain for several week and had semaglutide reduced to 0.2558mt her last visit. She is still having pain, usually after eating, although not as severe.   She was also noted to have significantly elevated transaminases, alk phoph, and T4 at that time. U/s done revealed  1cm mass c/w hemangioma, but otherwise normal liver and gallbladder. She is scheduled for abdominal later this week.    Medications: Outpatient Medications Prior to Visit  Medication Sig   ALPRAZolam (XANAX) 0.25 MG tablet Take 1 tablet (0.25 mg total) by mouth 2 (two) times daily as needed for anxiety.   aspirin 81 MG tablet Take 81 mg by mouth daily.   Blood Glucose Monitoring Suppl (ONE TOUCH ULTRA 2) w/Device KIT Use to check sugar daily   buPROPion (WELLBUTRIN XL) 150 MG 24 hr tablet Take 1 tablet (150 mg total) by mouth in the morning.   CALCIUM PO Take by mouth.   clotrimazole-betamethasone (LOTRISONE) cream Apply 1 application topically 2 (two) times daily.   cyclobenzaprine (FLEXERIL) 5 MG tablet Take 1 tablet (5 mg total) by mouth 3 (three) times daily as needed for muscle spasms.   gabapentin (NEURONTIN) 300 MG capsule TAKE ONE CAPSULE BY MOUTH AT BEDTIME   glucose blood (ONE TOUCH ULTRA TEST) test strip Check sugar daily   IRON PO Take by mouth.   Lancets MISC Use Once Daily   levothyroxine (EUTHYROX) 50 MCG tablet Take 1 tablet (50 mcg total) by mouth daily.   lisinopril (ZESTRIL) 10 MG tablet Take 1 tablet by mouth once daily   lovastatin (MEVACOR) 40 MG tablet Take 1 tablet by mouth once daily   Multiple Vitamins-Minerals (MULTIVITAMIN ADULT PO) Take 1 tablet  by mouth daily.   PARoxetine (PAXIL) 40 MG tablet Take 1 tablet (40 mg total) by mouth every morning. TAKE IN PLACE OF FLUOXETINE   Semaglutide, 1 MG/DOSE, (OZEMPIC, 1 MG/DOSE,) 4 MG/3ML SOPN INJECT 1MG INTO THE SKIN ONCE WEEKLY   Semaglutide,0.25 or 0.5MG/DOS, (OZEMPIC, 0.25 OR 0.5 MG/DOSE,) 2 MG/3ML SOPN Inject 0.25-0.5 mg into the skin once a week.   traZODone (DESYREL) 100 MG tablet TAKE 1/2 TO 1 (ONE-HALF TO ONE) TABLET BY MOUTH AT BEDTIME   vitamin B-12 (CYANOCOBALAMIN) 1000 MCG tablet Take 1 tablet by mouth daily.   zolpidem (AMBIEN) 10 MG tablet Take 0.5 tablets (5 mg total) by mouth at bedtime as needed. for  sleep   No facility-administered medications prior to visit.    Review of Systems  Constitutional:  Negative for appetite change, chills, fatigue and fever.  Respiratory:  Negative for chest tightness and shortness of breath.   Cardiovascular:  Negative for chest pain and palpitations.  Gastrointestinal:  Positive for abdominal pain (right flank pain). Negative for nausea and vomiting.  Neurological:  Negative for dizziness and weakness.  Psychiatric/Behavioral:  Positive for decreased concentration. The patient is nervous/anxious.        Objective    BP 127/74 (BP Location: Left Arm, Patient Position: Sitting, Cuff Size: Normal)   Pulse 87   Temp (!) 97.5 F (36.4 C) (Oral)   Resp 16   Wt 121 lb (54.9 kg)   LMP 01/10/2015   SpO2 100% Comment: room air  BMI 21.43 kg/m    Physical Exam  General appearance: Well developed, well nourished female, cooperative and in no acute distress Head: Normocephalic, without obvious abnormality, atraumatic Respiratory: Respirations even and unlabored, normal respiratory rate Extremities: All extremities are intact.  Skin: Skin color, texture, turgor normal. No rashes seen  Psych: Appropriate mood and affect. Neurologic: Mental status: Alert, oriented to person, place, and time, thought content appropriate.   Assessment & Plan     1. Panic attack Somewhat improved since last visit. Unclear if this is due to change to paroxetine, reduction of levothyroxine, or reduction in semaglutide. However, she is still having attacks daily so will increase to  PARoxetine (PAXIL) 40 MG tablet; Take 1.5 tablets (60 mg total) by mouth daily.  Dispense: 45 tablet; Refill: 2   2. Adult hypothyroidism Has reduced levothyroxine to 1/2 x 1mg since was elevated last month.  - TSH + free T4  3. Abnormal transaminases Abdominal CT scheduled this week. Battery of lab orders place last week to be drawn when our labtech returns 03/28/22  4. Elevated alkaline  phosphatase level   5. Elevated bilirubin   6. Primary insomnia Trazodone hasn't really helped. Has option to take up to one full tablet zolpidem daily - zolpidem (AMBIEN) 10 MG tablet; Take 0.5-1 tablets (5-10 mg total) by mouth at bedtime as needed. for sleep  Dispense: 30 tablet       The entirety of the information documented in the History of Present Illness, Review of Systems and Physical Exam were personally obtained by me. Portions of this information were initially documented by the CMA and reviewed by me for thoroughness and accuracy.     DLelon Huh MD  [phone](phone) [phone](fax)  [place]

## 2022-03-26 NOTE — Telephone Encounter (Signed)
Pt called and stated that she also needs FMLA paper worked faxed to [employer]. She states they were faxed to [employer].   Patient states that fax number should be on the form. Please advise.

## 2022-03-28 ENCOUNTER — Other Ambulatory Visit: Payer: Self-pay | Admitting: Family Medicine

## 2022-03-28 ENCOUNTER — Telehealth: Payer: Self-pay | Admitting: Family Medicine

## 2022-03-28 ENCOUNTER — Ambulatory Visit
Admission: RE | Admit: 2022-03-28 | Discharge: 2022-03-28 | Disposition: A | Discharge status: Home / Self Care | Payer: BC Managed Care – PPO | Source: Ambulatory Visit | Attending: Family Medicine | Admitting: Family Medicine

## 2022-03-28 DIAGNOSIS — K7689 Other specified diseases of liver: Secondary | ICD-10-CM | POA: Diagnosis not present

## 2022-03-28 DIAGNOSIS — F5101 Primary insomnia: Secondary | ICD-10-CM

## 2022-03-28 DIAGNOSIS — R1084 Generalized abdominal pain: Secondary | ICD-10-CM | POA: Insufficient documentation

## 2022-03-28 DIAGNOSIS — R188 Other ascites: Secondary | ICD-10-CM | POA: Diagnosis not present

## 2022-03-28 DIAGNOSIS — K828 Other specified diseases of gallbladder: Secondary | ICD-10-CM | POA: Diagnosis not present

## 2022-03-28 DIAGNOSIS — R748 Abnormal levels of other serum enzymes: Secondary | ICD-10-CM | POA: Insufficient documentation

## 2022-03-28 DIAGNOSIS — R109 Unspecified abdominal pain: Secondary | ICD-10-CM | POA: Diagnosis not present

## 2022-03-28 MED ORDER — ZOLPIDEM TARTRATE 10 MG PO TABS: 5.0000 mg | ORAL_TABLET | Freq: Every evening | ORAL | 5 refills | Status: DC | PRN

## 2022-03-28 MED ORDER — IOHEXOL 300 MG/ML  SOLN
100.0000 mL | Freq: Once | INTRAMUSCULAR | Status: AC | PRN
  Administered 2022-03-28: 100 mL via INTRAVENOUS

## 2022-03-28 NOTE — Telephone Encounter (Signed)
Pt states her FMLA RTW date on her form is 03/21/22.  She is requesting a line be marked through that date an a date 12 wks from 02/21/22 be placed on the form.   Also, stated her Prozac wasn't called into the pharmacy with the dosing change.

## 2022-03-28 NOTE — Telephone Encounter (Signed)
I called pharmacy and confirmed that prescription for paroxetine was received on 03/26/2022. I called patient and advised her that the pharmacy has the prescription ready.   Please review message below regarding changing the dates on her FMLA forms.   Patient also states that [employer] is requesting more information and will be faxing a request for medical records. I checked with medical records and we did receive request for an "attending physicians statement". Forms need to be returned by 04/03/2022.  Forms placed on Dr. Maralyn Sago desk for review.

## 2022-03-28 NOTE — Telephone Encounter (Signed)
Requested medication (s) are due for refill today - no  Requested medication (s) are on the active medication list -yes  Future visit scheduled -yes  Last refill: 03/26/22 #30  Notes to clinic: non delegated rx, listed as "no print" on medication list  Requested Prescriptions  Pending Prescriptions Disp Refills   zolpidem (AMBIEN) 10 MG tablet [Pharmacy Med Name: Zolpidem Tartrate 10 MG Oral Tablet] 30 tablet 0    Sig: TAKE 1 TABLET BY MOUTH AT BEDTIME AS NEEDED FOR SLEEP     Not Delegated - Psychiatry:  Anxiolytics/Hypnotics Failed - 03/28/2022  9:13 AM      Failed - This refill cannot be delegated      Failed - Urine Drug Screen completed in last 360 days      Passed - Valid encounter within last 6 months    Recent Outpatient Visits           2 days ago Panic attack   [facility] [name], MD   1 month ago Adult hypothyroidism   [facility] [name], MD   1 month ago Depression, unspecified depression type   [facility] [name], MD   3 months ago Type 2 diabetes mellitus with hyperlipidemia [facility])   [facility] [name], MD   11 months ago Type 2 diabetes mellitus with hyperlipidemia [facility])   [place], [name], MD       Future Appointments             In 2 weeks [name], [name], MD [facility], [place]               Requested Prescriptions  Pending Prescriptions Disp Refills   zolpidem (AMBIEN) 10 MG tablet [Pharmacy Med Name: Zolpidem Tartrate 10 MG Oral Tablet] 30 tablet 0    Sig: TAKE 1 TABLET BY MOUTH AT BEDTIME AS NEEDED FOR SLEEP     Not Delegated - Psychiatry:  Anxiolytics/Hypnotics Failed - 03/28/2022  9:13 AM      Failed - This refill cannot be delegated      Failed - Urine Drug Screen completed in last 360 days      Passed - Valid encounter within last 6 months    Recent Outpatient Visits           2 days ago Panic  attack   [facility] [name], MD   1 month ago Adult hypothyroidism   [facility] [name], MD   1 month ago Depression, unspecified depression type   [facility] [name], MD   3 months ago Type 2 diabetes mellitus with hyperlipidemia [facility])   [facility] [name], MD   11 months ago Type 2 diabetes mellitus with hyperlipidemia [facility])   [facility] [name], MD       Future Appointments             In 2 weeks [name], [name], MD [facility]

## 2022-03-28 NOTE — Telephone Encounter (Signed)
Requested medication (s) are due for refill today: Yes  Requested medication (s) are on the active medication list: Yes  Last refill:  03/26/22  Future visit scheduled: Yes  Notes to clinic:  Unable to refill per protocol, cannot delegate, resend the medication       Requested Prescriptions  Pending Prescriptions Disp Refills   zolpidem (AMBIEN) 10 MG tablet 30 tablet     Sig: Take 0.5-1 tablets (5-10 mg total) by mouth at bedtime as needed. for sleep     Not Delegated - Psychiatry:  Anxiolytics/Hypnotics Failed - 03/28/2022  4:24 PM      Failed - This refill cannot be delegated      Failed - Urine Drug Screen completed in last 360 days      Passed - Valid encounter within last 6 months    Recent Outpatient Visits           2 days ago Panic attack   [facility] [name], MD   1 month ago Adult hypothyroidism   [facility] [name], MD   1 month ago Depression, unspecified depression type   [facility] [name], MD   3 months ago Type 2 diabetes mellitus with hyperlipidemia [facility])   [facility] [name], MD   11 months ago Type 2 diabetes mellitus with hyperlipidemia [facility])   [facility] [name], MD       Future Appointments             In 2 weeks [name], [name], MD [facility], [place]

## 2022-03-28 NOTE — Telephone Encounter (Signed)
Pt stated she was advised by her pharmacy that they had not received Rx request   Medication Refill - Medication: zolpidem (AMBIEN) 10 MG tablet  Has the patient contacted their pharmacy? Yes.    Preferred Pharmacy (with phone number or street name):  [facility], [place] Phone:  [phone]  Fax:  [phone]     Has the patient been seen for an appointment in the last year OR does the patient have an upcoming appointment? Yes.    Agent: Please be advised that RX refills may take up to 3 business days. We ask that you follow-up with your pharmacy.

## 2022-03-29 ENCOUNTER — Telehealth: Payer: Self-pay | Admitting: *Deleted

## 2022-03-29 DIAGNOSIS — R1084 Generalized abdominal pain: Secondary | ICD-10-CM | POA: Diagnosis not present

## 2022-03-29 DIAGNOSIS — R748 Abnormal levels of other serum enzymes: Secondary | ICD-10-CM | POA: Diagnosis not present

## 2022-03-29 NOTE — Telephone Encounter (Signed)
'  Barbara Harper' with [facility] radiology calling with imaging report. Available in EPIC:   IMPRESSION: 1. Somewhat contracted gallbladder with a thickened, hyperenhancing gallbladder wall measuring up to 0.5 cm. No gallstones. No biliary ductal dilatation. This appearance suggests cholecystitis, possibly chronic and acalculous given reported chronicity of symptoms and absence of evidence of gallstones by either CT or ultrasound. 2. Small lesion of the posterior liver dome, hepatic segment VII measuring 1.2 x 1.1 cm, with discontinuous peripheral nodular contrast enhancement. Findings correspond to prior ultrasound and are consistent with a small benign hemangioma for which no further follow-up or characterization is required. 3. Small volume perihepatic ascites. 4. 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

## 2022-04-01 LAB — HEPATITIS B SURFACE ANTIBODY,QUALITATIVE: Hep B Surface Ab, Qual: NONREACTIVE

## 2022-04-01 LAB — HEPATIC FUNCTION PANEL
ALT: 93 IU/L — ABNORMAL HIGH (ref 0–32)
AST: 118 IU/L — ABNORMAL HIGH (ref 0–40)
Albumin: 3.1 g/dL — ABNORMAL LOW (ref 3.9–4.9)
Alkaline Phosphatase: 204 IU/L — ABNORMAL HIGH (ref 44–121)
Bilirubin Total: 2.4 mg/dL — ABNORMAL HIGH (ref 0.0–1.2)
Bilirubin, Direct: 1.41 mg/dL — ABNORMAL HIGH (ref 0.00–0.40)
Total Protein: 5.9 g/dL — ABNORMAL LOW (ref 6.0–8.5)

## 2022-04-01 LAB — ANA W/REFLEX IF POSITIVE
Anti JO-1: <0.2 AI (ref 0.0–0.9)
Anti Nuclear Antibody (ANA): POSITIVE — AB
Centromere Ab Screen: <0.2 AI (ref 0.0–0.9)
Chromatin Ab SerPl-aCnc: <0.2 AI (ref 0.0–0.9)
ENA RNP Ab: 1.2 AI — ABNORMAL HIGH (ref 0.0–0.9)
ENA SM Ab Ser-aCnc: <0.2 AI (ref 0.0–0.9)
ENA SSA (RO) Ab: 0.6 AI (ref 0.0–0.9)
ENA SSB (LA) Ab: <0.2 AI (ref 0.0–0.9)
Scleroderma (Scl-70) (ENA) Antibody, IgG: <0.2 AI (ref 0.0–0.9)
dsDNA Ab: 1 IU/mL (ref 0–9)

## 2022-04-01 LAB — ANTI-SMOOTH MUSCLE ANTIBODY, IGG: Smooth Muscle Ab: 7 Units (ref 0–19)

## 2022-04-01 LAB — TSH+FREE T4
Free T4: 1.19 ng/dL (ref 0.82–1.77)
TSH: 3.02 u[IU]/mL (ref 0.450–4.500)

## 2022-04-01 LAB — EPSTEIN-BARR VIRUS VCA, IGM: EBV VCA IgM: <36 U/mL (ref 0.0–35.9)

## 2022-04-01 LAB — IRON,TIBC AND FERRITIN PANEL
Ferritin: 442 ng/mL — ABNORMAL HIGH (ref 15–150)
Iron Saturation: 79 % (ref 15–55)
Iron: 150 ug/dL — ABNORMAL HIGH (ref 27–139)
Total Iron Binding Capacity: 189 ug/dL — ABNORMAL LOW (ref 250–450)
UIBC: 39 ug/dL — ABNORMAL LOW (ref 118–369)

## 2022-04-01 LAB — PROTIME-INR
INR: 1.2 (ref 0.9–1.2)
Prothrombin Time: 12.5 s — ABNORMAL HIGH (ref 9.1–12.0)

## 2022-04-01 LAB — ALPHA-1-ANTITRYPSIN: A-1 Antitrypsin: 133 mg/dL (ref 101–187)

## 2022-04-01 LAB — MITOCHONDRIAL ANTIBODIES: Mitochondrial Ab: <20 Units (ref 0.0–20.0)

## 2022-04-02 ENCOUNTER — Telehealth: Payer: Self-pay

## 2022-04-02 NOTE — Telephone Encounter (Signed)
Copied from [place] [phone]. Topic: General - Other >> Apr 02, 2022 12:40 PM Everette C wrote: Reason for CRM: [name] with [employer] has called requesting to speak with a member of clinical staff regarding an extension of the patient's FMLA leave   The patient's employer was under the impression that the patient was on leave until 03/21/22  If the patient's leave has been extended, [employer] is requesting an updated FMLA form   The patient's short term disability program through [employer] has requested additional office notes as well. The patient's short term disability status is still pending until the office notes have been received   Please fax info back to [name] at [phone]   Please contact further if needed

## 2022-04-03 NOTE — Telephone Encounter (Signed)
Paperwork completed and faxed. I called [name] and confirmed receipt. [name] states she has everything she needs at this time.

## 2022-04-09 ENCOUNTER — Telehealth: Payer: Self-pay

## 2022-04-09 DIAGNOSIS — R790 Abnormal level of blood mineral: Secondary | ICD-10-CM

## 2022-04-09 DIAGNOSIS — R7989 Other specified abnormal findings of blood chemistry: Secondary | ICD-10-CM

## 2022-04-09 NOTE — Telephone Encounter (Signed)
Erie Noe, RN  03/30/2022  5:15 PM EDT Back to Top    Pt given lab results per notes of Dr. Caryn Section on 03/30/22. Pt verbalized understanding.   Alanson Puls, Lely  03/30/2022  2:13 PM EDT     [facility] Okay for [facility] triage to advise.     [name], MD  03/30/2022 11:42 AM EDT     Liver functions are much better, although still high.  Iron levels are unusually high.... this is suspicious for hemochromatosis which is condition where liver is overloaded with iron. Needs referral to GI and to hematology for follow up.  Thyroid functions are much better. Continue '25mg'$  levothyroxine (1/2 of 46mg tablet). Let me know when current prescription runs out and we can change to '25mg'$  tablets.  There are still several labs that are pending that should be completed next week.

## 2022-04-09 NOTE — Telephone Encounter (Signed)
Referral order placed. Please schedule.

## 2022-04-13 ENCOUNTER — Ambulatory Visit: Payer: BC Managed Care – PPO | Admitting: Family Medicine

## 2022-04-20 NOTE — Telephone Encounter (Signed)
Patient called in to say she has had recent bloodwork done and she has been on FMLA. She was due to go back to work on Monday but she cannot still at this point because she is still feeling bad and extremely fatigued. Her iron is level is very high. She has an appointment with a hematologist on Monday and needs for someone at the office to send any paperwork necessary to [facility] contact [employer]. Please assist patient further

## 2022-04-20 NOTE — Telephone Encounter (Signed)
I don't understand what she wants. Does she just need a note or something?

## 2022-04-23 ENCOUNTER — Inpatient Hospital Stay: Payer: BC Managed Care – PPO | Attending: Internal Medicine | Admitting: Internal Medicine

## 2022-04-23 ENCOUNTER — Encounter: Payer: Self-pay | Admitting: Internal Medicine

## 2022-04-23 ENCOUNTER — Inpatient Hospital Stay: Payer: BC Managed Care – PPO

## 2022-04-23 DIAGNOSIS — E119 Type 2 diabetes mellitus without complications: Secondary | ICD-10-CM | POA: Diagnosis not present

## 2022-04-23 DIAGNOSIS — R7989 Other specified abnormal findings of blood chemistry: Secondary | ICD-10-CM | POA: Diagnosis not present

## 2022-04-23 LAB — COMPREHENSIVE METABOLIC PANEL
ALT: 78 U/L — ABNORMAL HIGH (ref 0–44)
AST: 102 U/L — ABNORMAL HIGH (ref 15–41)
Albumin: 2.7 g/dL — ABNORMAL LOW (ref 3.5–5.0)
Alkaline Phosphatase: 133 U/L — ABNORMAL HIGH (ref 38–126)
Anion gap: 6 (ref 5–15)
BUN: 11 mg/dL (ref 8–23)
CO2: 26 mmol/L (ref 22–32)
Calcium: 8.3 mg/dL — ABNORMAL LOW (ref 8.9–10.3)
Chloride: 109 mmol/L (ref 98–111)
Creatinine, Ser: 0.95 mg/dL (ref 0.44–1.00)
GFR, Estimated: >60 mL/min (ref >60)
Glucose, Bld: 167 mg/dL — ABNORMAL HIGH (ref 70–99)
Potassium: 4 mmol/L (ref 3.5–5.1)
Sodium: 141 mmol/L (ref 135–145)
Total Bilirubin: 1.1 mg/dL (ref 0.3–1.2)
Total Protein: 6.2 g/dL — ABNORMAL LOW (ref 6.5–8.1)

## 2022-04-23 LAB — IRON AND TIBC
Iron: 120 ug/dL (ref 28–170)
Saturation Ratios: 48 % — ABNORMAL HIGH (ref 10.4–31.8)
TIBC: 249 ug/dL — ABNORMAL LOW (ref 250–450)
UIBC: 129 ug/dL

## 2022-04-23 LAB — LACTATE DEHYDROGENASE: LDH: 182 U/L (ref 98–192)

## 2022-04-23 LAB — FERRITIN: Ferritin: 235 ng/mL (ref 11–307)

## 2022-04-23 NOTE — Progress Notes (Signed)
[place] NOTE  Patient Care Team: [name], MD as PCP - General (Family Medicine) Ammie Dalton, Okey Regal, MD (Unknown Physician Specialty) Lorelee Cover., MD (Ophthalmology) Cammie Sickle, MD as Consulting Physician (Oncology)  CHIEF COMPLAINTS/PURPOSE OF CONSULTATION: Ferritin elevated  #   Latest Reference Range & Units 03/29/22 16:33  Iron 27 - 139 ug/dL 150 (H)  UIBC 118 - 369 ug/dL 39 (L)  TIBC 250 - 450 ug/dL 189 (L)  Ferritin 15 - 150 ng/mL 442 (H)  Iron Saturation 15 - 55 % 79 (Trainer)  ([place]): Data is critically high (H): Data is abnormally high (L): Data is abnormally low  # DM-2 on ozempic [on hold]  # gastric by pass [6269; [facility]]  Oncology History   No history exists.    HISTORY OF PRESENTING ILLNESS: Alone.  Ambulating independently.  Barbara Harper 61 y.o.  female with prior history of gastric bypass; diabetes has been referred to [place] for further evaluation recommendations for elevated ferritin/iron saturation.  Patient recently underwent evaluation with PCP for elevated LFTs; that included a CT scan of the abdomen pelvis.  CT scan showed gallbladder thickening concerning for cholecystitis.  Of note patient also had iron studies-that showed elevated ferritin of 442 saturation of 79%.  Also extensive work-up for elevated LFTs including hepatitis panel; autoimmune work-up negative.   Patient does not have a history of any iron deficiency or history of any hemochromatosis.  No family history of any liver disease or liver cancer.  Review of Systems  Constitutional:  Positive for malaise/fatigue. Negative for chills, diaphoresis, fever and weight loss.  HENT:  Negative for nosebleeds and sore throat.   Eyes:  Negative for double vision.  Respiratory:  Negative for cough, hemoptysis, sputum production, shortness of breath and wheezing.   Cardiovascular:  Negative for chest pain, palpitations, orthopnea and leg swelling.   Gastrointestinal:  Positive for abdominal pain. Negative for blood in stool, constipation, diarrhea, heartburn, melena, nausea and vomiting.  Genitourinary:  Negative for dysuria, frequency and urgency.  Musculoskeletal:  Positive for back pain and joint pain.  Skin: Negative.  Negative for itching and rash.  Neurological:  Negative for dizziness, tingling, focal weakness, weakness and headaches.  Endo/Heme/Allergies:  Does not bruise/bleed easily.  Psychiatric/Behavioral:  Negative for depression. The patient is not nervous/anxious and does not have insomnia.     MEDICAL HISTORY:  Past Medical History:  Diagnosis Date   Diabetes mellitus without complication ([place])    Hyperlipidemia    Restless leg syndrome    Thyroid disease     SURGICAL HISTORY: Past Surgical History:  Procedure Laterality Date   CESAREAN SECTION     x3   Excision of subcutaneous mass  07/24/2004   left anterior lateral chest wall. Dr. Bary Castilla   GASTRIC BYPASS  2008   TUBAL LIGATION  1999    SOCIAL HISTORY: Social History   Socioeconomic History   Marital status: Widowed    Spouse name: Not on file   Number of children: 3   Years of education: Not on file   Highest education level: Not on file  Occupational History   Occupation: Works at [employer]  Tobacco Use   Smoking status: Never   Smokeless tobacco: Never  Vaping Use   Vaping Use: Never used  Substance and Sexual Activity   Alcohol use: Yes    Alcohol/week: 0.0 standard drinks of alcohol    Comment: DRINKS WINE   Drug  use: No   Sexual activity: Yes  Other Topics Concern   Not on file  Social History Narrative   No smoking; rare alcohol; works for [employer]; lives in [place]; with fiance/daughter   Social Determinants of Health   Financial Resource Strain: Not on file  Food Insecurity: Not on file  Transportation Needs: Not on file  Physical Activity: Not on file  Stress: Not on file  Social Connections: Not on file   Intimate Partner Violence: Not on file    FAMILY HISTORY: Family History  Problem Relation Age of Onset   Diabetes Mother        type 2   Diabetes Maternal Grandmother    Epilepsy Daughter    Hypercholesterolemia Son    Diabetes Other        type 2    ALLERGIES:  is allergic to multivitamin  [centrum] and penicillin g.  MEDICATIONS:  Current Outpatient Medications  Medication Sig Dispense Refill   ALPRAZolam (XANAX) 0.25 MG tablet Take 1 tablet (0.25 mg total) by mouth 2 (two) times daily as needed for anxiety. 30 tablet 1   aspirin 81 MG tablet Take 81 mg by mouth daily.     Blood Glucose Monitoring Suppl (ONE TOUCH ULTRA 2) w/Device KIT Use to check sugar daily 1 each 0   buPROPion (WELLBUTRIN XL) 150 MG 24 hr tablet Take 1 tablet (150 mg total) by mouth in the morning. 30 tablet 1   cyclobenzaprine (FLEXERIL) 5 MG tablet Take 1 tablet (5 mg total) by mouth 3 (three) times daily as needed for muscle spasms. 30 tablet 0   gabapentin (NEURONTIN) 300 MG capsule TAKE ONE CAPSULE BY MOUTH AT BEDTIME 30 capsule 5   glucose blood (ONE TOUCH ULTRA TEST) test strip Check sugar daily 100 each 4   Lancets MISC Use Once Daily 100 each 5   levothyroxine (EUTHYROX) 50 MCG tablet Take 1 tablet (50 mcg total) by mouth daily. 30 tablet 1   lisinopril (ZESTRIL) 10 MG tablet Take 1 tablet by mouth once daily 90 tablet 4   lovastatin (MEVACOR) 40 MG tablet Take 1 tablet by mouth once daily 90 tablet 4   PARoxetine (PAXIL) 40 MG tablet Take 1.5 tablets (60 mg total) by mouth daily. 45 tablet 2   traZODone (DESYREL) 100 MG tablet TAKE 1/2 TO 1 (ONE-HALF TO ONE) TABLET BY MOUTH AT BEDTIME 30 tablet 3   vitamin B-12 (CYANOCOBALAMIN) 1000 MCG tablet Take 1 tablet by mouth daily.     zolpidem (AMBIEN) 10 MG tablet TAKE 1 TABLET BY MOUTH AT BEDTIME AS NEEDED FOR SLEEP 30 tablet 5   clotrimazole-betamethasone (LOTRISONE) cream Apply 1 application topically 2 (two) times daily. (Patient not taking:  Reported on 04/23/2022) 30 g 0   Semaglutide, 1 MG/DOSE, (OZEMPIC, 1 MG/DOSE,) 4 MG/3ML SOPN INJECT 1MG INTO THE SKIN ONCE WEEKLY (Patient not taking: Reported on 03/26/2022) 9 mL 3   Semaglutide,0.25 or 0.5MG/DOS, (OZEMPIC, 0.25 OR 0.5 MG/DOSE,) 2 MG/3ML SOPN Inject 0.25-0.5 mg into the skin once a week. (Patient not taking: Reported on 04/23/2022) 3 mL 0   No current facility-administered medications for this visit.    PHYSICAL EXAMINATION: ECOG PERFORMANCE STATUS: 1 - Symptomatic but completely ambulatory  Vitals:   04/23/22 1415  BP: 123/74  Pulse: 84  Temp: 98.4 F (36.9 C)  SpO2: 100%   Filed Weights   04/23/22 1415  Weight: 122 lb 6.4 oz (55.5 kg)    Physical Exam Vitals and nursing note  reviewed.  HENT:     Head: Normocephalic and atraumatic.     Mouth/Throat:     Pharynx: Oropharynx is clear.  Eyes:     Extraocular Movements: Extraocular movements intact.     Pupils: Pupils are equal, round, and reactive to light.  Cardiovascular:     Rate and Rhythm: Normal rate and regular rhythm.  Pulmonary:     Comments: Decreased breath sounds bilaterally.  Abdominal:     Palpations: Abdomen is soft.  Musculoskeletal:        General: Normal range of motion.     Cervical back: Normal range of motion.  Skin:    General: Skin is warm.  Neurological:     General: No focal deficit present.     Mental Status: She is alert and oriented to person, place, and time.  Psychiatric:        Behavior: Behavior normal.        Judgment: Judgment normal.     LABORATORY DATA:  I have reviewed the data as listed Lab Results  Component Value Date   WBC 6.1 02/21/2022   HGB 13.3 02/21/2022   HCT 38.5 02/21/2022   MCV 90 02/21/2022   PLT 238 02/21/2022   Recent Labs    07/05/21 0905 02/21/22 1548 03/29/22 1633 04/23/22 1511  NA 144 141  --  141  K 4.5 3.9  --  4.0  CL 104 106  --  109  CO2 25 20  --  26  GLUCOSE 100* 246*  --  167*  BUN 9 11  --  11  CREATININE 0.89 0.98   --  0.95  CALCIUM 9.5 8.7  --  8.3*  GFRNONAA  --   --   --  >60  PROT 6.7 5.9* 5.9* 6.2*  ALBUMIN 4.4 3.5* 3.1* 2.7*  AST 20 288* 118* 102*  ALT 16 239* 93* 78*  ALKPHOS 88 235* 204* 133*  BILITOT 0.3 4.0* 2.4* 1.1  BILIDIR  --   --  1.41*  --     RADIOGRAPHIC STUDIES: I have personally reviewed the radiological images as listed and agreed with the findings in the report. CT ABDOMEN W WO CONTRAST  Result Date: 03/28/2022 CLINICAL DATA:  Right-sided abdominal pain for 1 month elevated bilirubin and LFTs, suspected left lobe liver mass by prior ultrasound EXAM: CT ABDOMEN WITHOUT AND WITH CONTRAST TECHNIQUE: Multidetector CT imaging of the abdomen was performed following the standard protocol before and following the bolus administration of intravenous contrast. RADIATION DOSE REDUCTION: This exam was performed according to the departmental dose-optimization program which includes automated exposure control, adjustment of the mA and/or kV according to patient size and/or use of iterative reconstruction technique. CONTRAST:  118m OMNIPAQUE IOHEXOL 300 MG/ML SOLN, additional oral enteric contrast COMPARISON:  Right upper quadrant ultrasound, 03/12/2022, MR abdomen, 01/19/2015 FINDINGS: Lower chest: No acute abnormality. Hepatobiliary: No solid liver abnormality is seen. Small lesion of the posterior liver dome, hepatic segment VII measuring 1.2 x 1.1 cm, with discontinuous peripheral nodular contrast enhancement (series 15, image 39). No gallstones. Somewhat contracted gallbladder with a thickened, hyperenhancing gallbladder wall measuring up to 0.5 cm (series 7, image 57). No biliary ductal dilatation Pancreas: Unremarkable. No pancreatic ductal dilatation or surrounding inflammatory changes. Spleen: Normal in size without significant abnormality. Adrenals/Urinary Tract: Adrenal glands are unremarkable. Kidneys are normal, without renal calculi, solid lesion, or hydronephrosis. 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 Appendix appears normal. No evidence of bowel wall thickening,  distention, or inflammatory changes. Vascular/Lymphatic: Aortic atherosclerosis. No enlarged abdominal lymph nodes. Other: No abdominal wall hernia or abnormality. Small volume perihepatic ascites. Musculoskeletal: No acute or significant osseous findings. IMPRESSION: 1. Somewhat contracted gallbladder with a thickened, hyperenhancing gallbladder wall measuring up to 0.5 cm. No gallstones. No biliary ductal dilatation. This appearance suggests cholecystitis, possibly chronic and acalculous given reported chronicity of symptoms and absence of evidence of gallstones by either CT or ultrasound. 2. Small lesion of the posterior liver dome, hepatic segment VII measuring 1.2 x 1.1 cm, with discontinuous peripheral nodular contrast enhancement. Findings correspond to prior ultrasound and are consistent with a small benign hemangioma for which no further follow-up or characterization is required. 3. Small volume perihepatic ascites. 4. 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 These results will be called to the ordering clinician or representative by the Radiologist Assistant, and communication documented in the PACS or [employer]. Aortic Atherosclerosis (ICD10-I70.0). Electronically Signed   By: Delanna Ahmadi M.D.   On: 03/28/2022 22:05     Elevated ferritin level #Elevated ferritin 442; iron saturation of 79%-the context of elevated LFTs [see below]-likely acute phase reactant.  However rule out any genetic causes of hemochromatosis.    #Elevated LFTs-question gallbladder pathology.  Defer to GI/primary care for further recommendations.  Thank you Dr.[name] for allowing me to participate in the care of your pleasant patient. Please do not hesitate to contact me with questions or concerns in the interim.  # DISPOSITION: # labs today # follow up in 2-3 weeks- MD; no  labs-Dr.B     Above plan of care was discussed with patient/family in detail.  My contact information was given to the patient/family.       Cammie Sickle, MD 04/23/2022 8:35 PM

## 2022-04-23 NOTE — Progress Notes (Signed)
C/o of hand tremors.

## 2022-04-23 NOTE — Assessment & Plan Note (Addendum)
#  Elevated ferritin 442; iron saturation of 79%-the context of elevated LFTs [see below]-likely acute phase reactant.  However rule out any genetic causes of hemochromatosis.    #Elevated LFTs-question gallbladder pathology.  Defer to GI/primary care for further recommendations.  Thank you Dr.[name] for allowing me to participate in the care of your pleasant patient. Please do not hesitate to contact me with questions or concerns in the interim.  # DISPOSITION: # labs today # follow up in 2-3 weeks- MD; no labs-Dr.B

## 2022-04-24 NOTE — Telephone Encounter (Signed)
Pt is stating that She spoke with Insurance / Shawna Orleans today  [phone]  Ext [phone] concerning her forms for extended  FMLA etc and they say they have faxed over additional paperwork  to [facility] on  8/4. [employer] system is not updated it still is 8/7 for pt to return to work. They have nothing stating 9/3. Call [place],  [phone]

## 2022-04-24 NOTE — Telephone Encounter (Signed)
I have re-faxed the paperwork with the return date of 05/20/22 to [facility]

## 2022-04-30 LAB — HEMOCHROMATOSIS DNA-PCR(C282Y,H63D)

## 2022-05-02 ENCOUNTER — Other Ambulatory Visit: Payer: Self-pay | Admitting: Family Medicine

## 2022-05-02 DIAGNOSIS — F419 Anxiety disorder, unspecified: Secondary | ICD-10-CM

## 2022-05-02 NOTE — Telephone Encounter (Signed)
Pt called to report that she is currently out of her current supply because she misplaced it. She says that she is currently at the pharmacy and is leaving to go to [facility] shortly and would like to have this approved before she goes.

## 2022-05-11 ENCOUNTER — Ambulatory Visit (INDEPENDENT_AMBULATORY_CARE_PROVIDER_SITE_OTHER): Payer: BC Managed Care – PPO | Admitting: Family Medicine

## 2022-05-11 ENCOUNTER — Encounter: Payer: Self-pay | Admitting: Family Medicine

## 2022-05-11 VITALS — BP 132/71 | HR 89 | Temp 98.2°F | Resp 16 | Wt 121.6 lb

## 2022-05-11 DIAGNOSIS — E1169 Type 2 diabetes mellitus with other specified complication: Secondary | ICD-10-CM | POA: Diagnosis not present

## 2022-05-11 DIAGNOSIS — E785 Hyperlipidemia, unspecified: Secondary | ICD-10-CM | POA: Diagnosis not present

## 2022-05-11 DIAGNOSIS — R7989 Other specified abnormal findings of blood chemistry: Secondary | ICD-10-CM | POA: Diagnosis not present

## 2022-05-11 DIAGNOSIS — F419 Anxiety disorder, unspecified: Secondary | ICD-10-CM

## 2022-05-11 DIAGNOSIS — E039 Hypothyroidism, unspecified: Secondary | ICD-10-CM

## 2022-05-11 DIAGNOSIS — R748 Abnormal levels of other serum enzymes: Secondary | ICD-10-CM

## 2022-05-11 LAB — POCT GLYCOSYLATED HEMOGLOBIN (HGB A1C)
Est. average glucose Bld gHb Est-mCnc: 123
Hemoglobin A1C: 5.9 % — AB (ref 4.0–5.6)

## 2022-05-11 MED ORDER — BUPROPION HCL ER (XL) 150 MG PO TB24: 150.0000 mg | ORAL_TABLET | Freq: Every morning | ORAL | 3 refills | Status: DC

## 2022-05-11 MED ORDER — ALPRAZOLAM 0.25 MG PO TABS: 0.2500 mg | ORAL_TABLET | Freq: Two times a day (BID) | ORAL | 1 refills | Status: DC | PRN

## 2022-05-11 NOTE — Progress Notes (Signed)
Established patient visit  I,Joseline E Rosas,acting as a scribe for Lelon Huh, MD.,have documented all relevant documentation on the behalf of Lelon Huh, MD,as directed by  Lelon Huh, MD while in the presence of Lelon Huh, MD.   Patient: Barbara Harper   DOB: 1960/12/29   61 y.o. Female  MRN: [id number] Visit Date: 05/11/2022  Today's healthcare provider: Lelon Huh, MD   Chief Complaint  Patient presents with   Follow-Up DM   Subjective    HPI  Diabetes Mellitus Type II, Follow-up  Lab Results  Component Value Date   HGBA1C 5.9 (A) 05/11/2022   HGBA1C 5.9 (A) 12/06/2021   HGBA1C 5.7 (H) 07/05/2021    Wt Readings from Last 5 Encounters:  05/11/22 121 lb 9.6 oz (55.2 kg)  04/23/22 122 lb 6.4 oz (55.5 kg)  03/26/22 121 lb (54.9 kg)  02/20/22 126 lb 1.6 oz (57.2 kg)  12/06/21 129 lb (58.5 kg)     Last seen for diabetes 5 months ago.  Management since then includes continue Ozempic as monotherapy.   Since then she developed hyperthyroidism, abdominal pain, elevated liver functions and alk phosphatase, elevate ferritin and iron levels. She was advised to stop Ozempic. She has since had CT of abdomen showing signs of chronic cholecystitis. She was referred to hematology for work up of elevated iron levels. This work up was normal and follow up iron studies and liver functions had nearly returned to normal when checked 04/23/2022. She had been having extreme anxiety and panic attacks prior to [place] finding that she was hyperthyroid. Dose of levothyroxine was reduced in half and these symptoms have since resolve. She is still having mild brief episodes of abdominal pain once a day, but has gotten much better over the last few months.    Symptoms: No fatigue No foot ulcerations  No appetite changes No nausea  No paresthesia of the feet  No polydipsia  No polyuria No visual disturbances   No vomiting     Home blood sugar records:  not being  checked  Episodes of hypoglycemia? No    Current insulin regiment: none Most Recent Eye Exam: reports UTD-had it a few months ago. Dr. Gloriann Loan. Current exercise: none Current diet habits: general  Pertinent Labs: Lab Results  Component Value Date   CHOL 172 12/06/2021   HDL 45 12/06/2021   LDLCALC 96 12/06/2021   TRIG 182 (H) 12/06/2021   CHOLHDL 3.8 12/06/2021   Lab Results  Component Value Date   NA 141 04/23/2022   K 4.0 04/23/2022   CREATININE 0.95 04/23/2022   GFRNONAA >60 04/23/2022   MICROALBUR 20 05/27/2017   LABMICR 12.9 12/06/2021     ---------------------------------------------------------------------------------------------------   Medications: Outpatient Medications Prior to Visit  Medication Sig   ALPRAZolam (XANAX) 0.25 MG tablet Take 1 tablet by mouth twice daily as needed for anxiety   aspirin 81 MG tablet Take 81 mg by mouth daily.   Blood Glucose Monitoring Suppl (ONE TOUCH ULTRA 2) w/Device KIT Use to check sugar daily   buPROPion (WELLBUTRIN XL) 150 MG 24 hr tablet Take 1 tablet (150 mg total) by mouth in the morning.   clotrimazole-betamethasone (LOTRISONE) cream Apply 1 application topically 2 (two) times daily. (Patient not taking: Reported on 04/23/2022)   cyclobenzaprine (FLEXERIL) 5 MG tablet Take 1 tablet (5 mg total) by mouth 3 (three) times daily as needed for muscle spasms.   gabapentin (NEURONTIN) 300 MG capsule TAKE ONE CAPSULE BY  MOUTH AT BEDTIME   glucose blood (ONE TOUCH ULTRA TEST) test strip Check sugar daily   Lancets MISC Use Once Daily   levothyroxine (EUTHYROX) 50 MCG tablet Take 1 tablet (50 mcg total) by mouth daily.   lisinopril (ZESTRIL) 10 MG tablet Take 1 tablet by mouth once daily   lovastatin (MEVACOR) 40 MG tablet Take 1 tablet by mouth once daily   PARoxetine (PAXIL) 40 MG tablet Take 1.5 tablets (60 mg total) by mouth daily.   Semaglutide, 1 MG/DOSE, (OZEMPIC, 1 MG/DOSE,) 4 MG/3ML SOPN INJECT 1MG  INTO THE SKIN ONCE WEEKLY  (Patient not taking: Reported on 03/26/2022)   Semaglutide,0.25 or 0.5MG /DOS, (OZEMPIC, 0.25 OR 0.5 MG/DOSE,) 2 MG/3ML SOPN Inject 0.25-0.5 mg into the skin once a week. (Patient not taking: Reported on 04/23/2022)   traZODone (DESYREL) 100 MG tablet TAKE 1/2 TO 1 (ONE-HALF TO ONE) TABLET BY MOUTH AT BEDTIME   vitamin B-12 (CYANOCOBALAMIN) 1000 MCG tablet Take 1 tablet by mouth daily.   zolpidem (AMBIEN) 10 MG tablet TAKE 1 TABLET BY MOUTH AT BEDTIME AS NEEDED FOR SLEEP   No facility-administered medications prior to visit.        Objective    BP 132/71 (BP Location: Left Arm, Patient Position: Sitting, Cuff Size: Small)   Pulse 89   Temp 98.2 F (36.8 C) (Oral)   Resp 16   Wt 121 lb 9.6 oz (55.2 kg)   LMP 01/10/2015   BMI 21.54 kg/m    Physical Exam  General appearance: Well developed, well nourished female, cooperative and in no acute distress Head: Normocephalic, without obvious abnormality, atraumatic Respiratory: Respirations even and unlabored, normal respiratory rate Extremities: All extremities are intact.  Skin: Skin color, texture, turgor normal. No rashes seen  Psych: Appropriate mood and affect. Neurologic: Mental status: Alert, oriented to person, place, and time, thought content appropriate.   Results for orders placed or performed in visit on 05/11/22  POCT glycosylated hemoglobin (Hb A1C)  Result Value Ref Range   Hemoglobin A1C 5.9 (A) 4.0 - 5.6 %   Est. average glucose Bld gHb Est-mCnc 123     Assessment & Plan     1. Type 2 diabetes mellitus with hyperlipidemia ([place]) Very well controlled, off metformin and Ozempic for several months. Suspect that [place] may have precipitated episode of cholecystitis as below.   2. Elevated ferritin level Nearly resolved upon rechecking by hematology  3. Abnormal transaminases Nearly resolved upon rechecking by hematology  4. Elevated alkaline phosphatase level Improving.   Will recheck liver functions in about 6  weeks. If continuing to improve will cancel GI appointment.   5. Adult hypothyroidism Feeling much less anxious since reducing dose of levothyroxine. Will recheck with labs above in 6 weeks.   6. Anxiety Doing well on current medications. . - ALPRAZolam (XANAX) 0.25 MG tablet; Take 1 tablet (0.25 mg total) by mouth 2 (two) times daily as needed. for anxiety  Dispense: 30 tablet; Refill: 1 - buPROPion (WELLBUTRIN XL) 150 MG 24 hr tablet; Take 1 tablet (150 mg total) by mouth in the morning.  Dispense: 30 tablet; Refill: 3      The entirety of the information documented in the History of Present Illness, Review of Systems and Physical Exam were personally obtained by me. Portions of this information were initially documented by the CMA and reviewed by me for thoroughness and accuracy.     Lelon Huh, MD  [facility] [phone] (phone) [phone] (fax)  [facility]

## 2022-05-11 NOTE — Patient Instructions (Signed)
Please review the attached list of medications and notify my office if there are any errors.   We will contact you in mid October to set up follow up blood tests. If your liver functions are back to normal at that time then you can cancel your GI appointment at [facility]

## 2022-05-14 ENCOUNTER — Telehealth: Payer: Self-pay | Admitting: Internal Medicine

## 2022-05-14 ENCOUNTER — Inpatient Hospital Stay: Payer: BC Managed Care – PPO | Admitting: Internal Medicine

## 2022-05-14 ENCOUNTER — Encounter: Payer: Self-pay | Admitting: Internal Medicine

## 2022-05-14 NOTE — Telephone Encounter (Signed)
Patient called to cancel appointment. She stated her labs came back good so she doesn't need appointment.

## 2022-05-14 NOTE — Telephone Encounter (Signed)
Dr. Jacinto Reap, does patient need to be re-scheduled to discuss lab results?

## 2022-06-26 ENCOUNTER — Other Ambulatory Visit: Payer: Self-pay | Admitting: Family Medicine

## 2022-06-26 DIAGNOSIS — B353 Tinea pedis: Secondary | ICD-10-CM

## 2022-06-26 DIAGNOSIS — F419 Anxiety disorder, unspecified: Secondary | ICD-10-CM

## 2022-06-26 NOTE — Telephone Encounter (Signed)
Requested medication (s) are due for refill today: yes  Requested medication (s) are on the active medication list: yes  Last refill:  01/2020  Future visit scheduled: yes  Notes to clinic:  Pt is in [place] taking care of her mother, and the pharmacy will not fill.     Requested Prescriptions  Pending Prescriptions Disp Refills   clotrimazole-betamethasone (LOTRISONE) cream 30 g 0    Sig: Apply 1 Application topically 2 (two) times daily.     Off-Protocol Failed - 06/26/2022 11:52 AM      Failed - Medication not assigned to a protocol, review manually.      Passed - Valid encounter within last 12 months    Recent Outpatient Visits           1 month ago Type 2 diabetes mellitus with hyperlipidemia [facility])   [facility] [name], MD   3 months ago Panic attack   [facility] [name], MD   4 months ago Adult hypothyroidism   [facility] [name], MD   4 months ago Depression, unspecified depression type   [facility] [name], MD   6 months ago Type 2 diabetes mellitus with hyperlipidemia [facility])   [facility] [name], MD       Future Appointments             In 2 months [name], [name], MD [facility] [place]

## 2022-06-26 NOTE — Telephone Encounter (Signed)
Medication Refill - Medication: clotrimazole-betamethasone (LOTRISONE) cream  Has the patient contacted their pharmacy? No. Pt is in [place] taking care of her mother.  Pt states her psoriasis has flared up on her left foot.  It is on all the toes, comes up about 1 inch above toes across the foot Pt states Dr Caryn Section prescribed her In the past for this same issue.  Preferred Pharmacy (with phone number or street name):  [facility] ([place]), [place] - [phone] Has the patient been seen for an appointment in the last year OR does the patient have an upcoming appointment? Yes.    Agent: Please be advised that RX refills may take up to 3 business days. We ask that you follow-up with your pharmacy.

## 2022-06-26 NOTE — Telephone Encounter (Signed)
Requested medication (s) are due for refill today: yes  Requested medication (s) are on the active medication list: yes  Last refill:  05/11/22 #30/1  Future visit scheduled: yes  Notes to clinic:  not delegated, Pt is in [place] taking care of her mother, and the pharmacy will not fill.     Requested Prescriptions  Pending Prescriptions Disp Refills   ALPRAZolam (XANAX) 0.25 MG tablet 30 tablet 1    Sig: Take 1 tablet (0.25 mg total) by mouth 2 (two) times daily as needed. for anxiety     Not Delegated - Psychiatry: Anxiolytics/Hypnotics 2 Failed - 06/26/2022 11:33 AM      Failed - This refill cannot be delegated      Failed - Urine Drug Screen completed in last 360 days      Passed - Patient is not pregnant      Passed - Valid encounter within last 6 months    Recent Outpatient Visits           1 month ago Type 2 diabetes mellitus with hyperlipidemia [facility])   [facility] [name], MD   3 months ago Panic attack   [facility] [name], MD   4 months ago Adult hypothyroidism   [facility] [name], MD   4 months ago Depression, unspecified depression type   [facility] [name], MD   6 months ago Type 2 diabetes mellitus with hyperlipidemia [facility])   [facility] [name], MD       Future Appointments             In 2 months [name], [name], MD [facility], [place]

## 2022-06-26 NOTE — Telephone Encounter (Signed)
Medication Refill - Medication: ALPRAZolam (XANAX) 0.25 MG tablet zolpidem (AMBIEN) 10 MG tablet  Has the patient contacted their pharmacy? Yes.   Pt is in [place] taking care of her mother, and the pharmacy will not fill.  Preferred Pharmacy (with phone number or street name): [place] ([place]), [place] - [phone]  Has the patient been seen for an appointment in the last year OR does the patient have an upcoming appointment? Yes.    Agent: Please be advised that RX refills may take up to 3 business days. We ask that you follow-up with your pharmacy.  Pt states she hopes to get today. Can you call the pt if there is an issue?

## 2022-06-27 MED ORDER — CLOTRIMAZOLE-BETAMETHASONE 1-0.05 % EX CREA: 1.0000 | TOPICAL_CREAM | Freq: Two times a day (BID) | CUTANEOUS | 3 refills | Status: AC

## 2022-06-27 MED ORDER — ALPRAZOLAM 0.25 MG PO TABS: 0.2500 mg | ORAL_TABLET | Freq: Two times a day (BID) | ORAL | 3 refills | Status: DC | PRN

## 2022-07-05 ENCOUNTER — Encounter: Payer: Self-pay | Admitting: Family Medicine

## 2022-07-05 ENCOUNTER — Telehealth: Payer: Self-pay | Admitting: Family Medicine

## 2022-07-05 DIAGNOSIS — E039 Hypothyroidism, unspecified: Secondary | ICD-10-CM

## 2022-07-05 DIAGNOSIS — R748 Abnormal levels of other serum enzymes: Secondary | ICD-10-CM

## 2022-07-05 DIAGNOSIS — R7989 Other specified abnormal findings of blood chemistry: Secondary | ICD-10-CM

## 2022-07-05 NOTE — Telephone Encounter (Signed)
Please advise patient it's time to recheck labs related her liver functions and iron levels, as well as thyroid functions. Order has been entered, does not need to be fasting If these are back to normal then she won't need to see the gastroenterologist.

## 2022-07-05 NOTE — Telephone Encounter (Signed)
LMOVM for pt to return call. Okay for pec triage to advise patient. Thanks.

## 2022-07-07 ENCOUNTER — Other Ambulatory Visit: Payer: Self-pay | Admitting: Family Medicine

## 2022-07-07 DIAGNOSIS — E039 Hypothyroidism, unspecified: Secondary | ICD-10-CM

## 2022-07-09 NOTE — Telephone Encounter (Signed)
I don't know. The orders have been sent through the [facility] interface. She can contact the [facility] in [place] and see if they have the order.

## 2022-07-09 NOTE — Telephone Encounter (Signed)
The patient would like to know if their labs can be doe in [place]   They are currently visiting to provide care for their mother   The patient would like to have their labs done at   [facility] Address: [address], [place] [address] Phone: [phone]  Please contact the patient further if needed

## 2022-07-09 NOTE — Telephone Encounter (Signed)
Pt returned the call. Advised per message below from Dr Caryn Section.   Pt expressed understanding.

## 2022-07-12 LAB — CBC
Hematocrit: 38.4 % (ref 34.0–46.6)
Hemoglobin: 12.7 g/dL (ref 11.1–15.9)
MCH: 31.2 pg (ref 26.6–33.0)
MCHC: 33.1 g/dL (ref 31.5–35.7)
MCV: 94 fL (ref 79–97)
Platelets: 295 10*3/uL (ref 150–450)
RBC: 4.07 x10E6/uL (ref 3.77–5.28)
RDW: 11.5 % — ABNORMAL LOW (ref 11.7–15.4)
WBC: 5.8 10*3/uL (ref 3.4–10.8)

## 2022-07-12 LAB — IRON,TIBC AND FERRITIN PANEL
Ferritin: 91 ng/mL (ref 15–150)
Iron Saturation: 35 % (ref 15–55)
Iron: 112 ug/dL (ref 27–139)
Total Iron Binding Capacity: 316 ug/dL (ref 250–450)
UIBC: 204 ug/dL (ref 118–369)

## 2022-07-12 LAB — T4, FREE: Free T4: 1.11 ng/dL (ref 0.82–1.77)

## 2022-07-12 LAB — TSH: TSH: 4.4 u[IU]/mL (ref 0.450–4.500)

## 2022-07-27 ENCOUNTER — Other Ambulatory Visit: Payer: Self-pay | Admitting: Family Medicine

## 2022-07-27 DIAGNOSIS — F5101 Primary insomnia: Secondary | ICD-10-CM

## 2022-07-27 NOTE — Telephone Encounter (Signed)
Requested Prescriptions  Pending Prescriptions Disp Refills   traZODone (DESYREL) 100 MG tablet [Pharmacy Med Name: traZODone HCl 100 MG Oral Tablet] 30 tablet 0    Sig: TAKE 1/2 TO 1 (ONE-HALF TO ONE) TABLET BY MOUTH AT BEDTIME     Psychiatry: Antidepressants - Serotonin Modulator Passed - 07/27/2022  1:45 PM      Passed - Valid encounter within last 6 months    Recent Outpatient Visits           2 months ago Type 2 diabetes mellitus with hyperlipidemia [facility])   [facility] [name], MD   4 months ago Panic attack   [facility] [name], MD   5 months ago Adult hypothyroidism   [facility] [name], MD   5 months ago Depression, unspecified depression type   [facility] [name], MD   7 months ago Type 2 diabetes mellitus with hyperlipidemia [facility])   [place], [name], MD       Future Appointments             In 1 month [name], [name], MD [facility]             PARoxetine (PAXIL) 40 MG tablet [Pharmacy Med Name: PARoxetine HCl 40 MG Oral Tablet] 45 tablet 0    Sig: TAKE 1 & 1/2 (ONE & ONE-HALF) TABLETS BY MOUTH ONCE DAILY     Psychiatry:  Antidepressants - SSRI Passed - 07/27/2022  1:45 PM      Passed - Valid encounter within last 6 months    Recent Outpatient Visits           2 months ago Type 2 diabetes mellitus with hyperlipidemia [facility])   [facility] [name], MD   4 months ago Panic attack   [facility] [name], MD   5 months ago Adult hypothyroidism   [name]   5 months ago Depression, unspecified depression type   [facility] [name], MD   7 months ago Type 2 diabetes mellitus with hyperlipidemia [facility])   [facility] [name], MD       Future Appointments             In 1 month  [name], [name], MD [facility] [place]

## 2022-08-28 ENCOUNTER — Other Ambulatory Visit: Payer: Self-pay | Admitting: Family Medicine

## 2022-08-28 DIAGNOSIS — F5101 Primary insomnia: Secondary | ICD-10-CM

## 2022-08-28 NOTE — Telephone Encounter (Signed)
Requested Prescriptions  Pending Prescriptions Disp Refills   traZODone (DESYREL) 100 MG tablet [Pharmacy Med Name: traZODone HCl 100 MG Oral Tablet] 30 tablet 0    Sig: TAKE 1/2 TO 1 (ONE-HALF TO ONE) TABLET BY MOUTH AT BEDTIME     Psychiatry: Antidepressants - Serotonin Modulator Passed - 08/28/2022 10:44 AM      Passed - Valid encounter within last 6 months    Recent Outpatient Visits           3 months ago Type 2 diabetes mellitus with hyperlipidemia [facility])   [facility] [name], MD   5 months ago Panic attack   [facility] [name], MD   6 months ago Adult hypothyroidism   [facility] [name], MD   6 months ago Depression, unspecified depression type   [facility] [name], MD   8 months ago Type 2 diabetes mellitus with hyperlipidemia [facility])   [facility] [name], MD       Future Appointments             In 1 week [name], [name], MD [facility], [place]

## 2022-09-05 ENCOUNTER — Ambulatory Visit: Payer: BC Managed Care – PPO | Admitting: Family Medicine

## 2022-09-26 ENCOUNTER — Telehealth: Payer: Self-pay | Admitting: Family Medicine

## 2022-09-26 DIAGNOSIS — F5101 Primary insomnia: Secondary | ICD-10-CM

## 2022-09-26 NOTE — Telephone Encounter (Signed)
Requested Prescriptions  Pending Prescriptions Disp Refills   traZODone (DESYREL) 100 MG tablet [Pharmacy Med Name: traZODone HCl 100 MG Oral Tablet] 90 tablet 0    Sig: TAKE 1/2 TO 1 (ONE-HALF TO ONE) TABLET BY MOUTH ONCE DAILY AT BEDTIME     Psychiatry: Antidepressants - Serotonin Modulator Passed - 09/26/2022  9:23 AM      Passed - Valid encounter within last 6 months    Recent Outpatient Visits           4 months ago Type 2 diabetes mellitus with hyperlipidemia [facility])   [facility] [name], MD   6 months ago Panic attack   [facility] [name], MD   7 months ago Adult hypothyroidism   [facility] [name], MD   7 months ago Depression, unspecified depression type   [facility] [name], MD   9 months ago Type 2 diabetes mellitus with hyperlipidemia [facility])   [facility] [name], MD       Future Appointments             In 1 week [name], [name], MD [facility], [place]

## 2022-10-04 ENCOUNTER — Other Ambulatory Visit: Payer: Self-pay | Admitting: Family Medicine

## 2022-10-04 DIAGNOSIS — F5101 Primary insomnia: Secondary | ICD-10-CM

## 2022-10-04 MED ORDER — ZOLPIDEM TARTRATE 10 MG PO TABS
10.0000 mg | ORAL_TABLET | Freq: Every evening | ORAL | 5 refills | Status: DC | PRN
Start: 2022-10-04 — End: 2023-04-01

## 2022-10-04 MED ORDER — TRAZODONE HCL 100 MG PO TABS: ORAL_TABLET | ORAL | 1 refills | Status: DC

## 2022-10-04 NOTE — Telephone Encounter (Signed)
Please review. Last office visit 05/11/2022.  KP

## 2022-10-04 NOTE — Addendum Note (Signed)
Addended by: [name] on: 10/04/2022 05:53 PM   Modules accepted: Orders

## 2022-10-04 NOTE — Telephone Encounter (Signed)
Pt called saying she is back in [place].  She wants to know if the trazodone and Ambien could be sent to the [facility] on [address].  CB#  [phone]

## 2022-10-08 ENCOUNTER — Encounter: Payer: Self-pay | Admitting: Family Medicine

## 2022-10-08 ENCOUNTER — Ambulatory Visit (INDEPENDENT_AMBULATORY_CARE_PROVIDER_SITE_OTHER): Payer: BC Managed Care – PPO | Admitting: Family Medicine

## 2022-10-08 VITALS — BP 120/73 | HR 87 | Temp 97.6°F | Ht 63.0 in | Wt 127.0 lb

## 2022-10-08 DIAGNOSIS — Z23 Encounter for immunization: Secondary | ICD-10-CM | POA: Diagnosis not present

## 2022-10-08 DIAGNOSIS — E1169 Type 2 diabetes mellitus with other specified complication: Secondary | ICD-10-CM | POA: Diagnosis not present

## 2022-10-08 DIAGNOSIS — E785 Hyperlipidemia, unspecified: Secondary | ICD-10-CM

## 2022-10-08 DIAGNOSIS — I10 Essential (primary) hypertension: Secondary | ICD-10-CM | POA: Diagnosis not present

## 2022-10-08 DIAGNOSIS — E039 Hypothyroidism, unspecified: Secondary | ICD-10-CM | POA: Diagnosis not present

## 2022-10-08 NOTE — Progress Notes (Signed)
Established patient visit   Patient: Barbara Harper   DOB: 1961-05-13   62 y.o. Female  MRN: [id number] Visit Date: 10/08/2022  Today's healthcare provider: Lelon Huh, MD   Chief Complaint  Patient presents with   Diabetes   Hypothyroidism   Subjective     Here today to follow up on diabetes and hypothryroidism.  Lab Results  Component Value Date   TSH 4.400 07/11/2022   Lab Results  Component Value Date   HGBA1C 5.9 (A) 05/11/2022   Lab Results  Component Value Date   NA 141 04/23/2022   K 4.0 04/23/2022   CREATININE 0.95 04/23/2022   EGFR 66 02/21/2022   GFRNONAA >60 04/23/2022   GLUCOSE 167 (H) 04/23/2022   Lab Results  Component Value Date   WBC 5.8 07/11/2022   HGB 12.7 07/11/2022   HCT 38.4 07/11/2022   MCV 94 07/11/2022   PLT 295 07/11/2022   She feels well with no complaints today. She has been staying with her mother in [place] the last few months.   Medications: Outpatient Medications Prior to Visit  Medication Sig   ALPRAZolam (XANAX) 0.25 MG tablet Take 1 tablet (0.25 mg total) by mouth 2 (two) times daily as needed. for anxiety   aspirin 81 MG tablet Take 81 mg by mouth daily.   Blood Glucose Monitoring Suppl (ONE TOUCH ULTRA 2) w/Device KIT Use to check sugar daily   buPROPion (WELLBUTRIN XL) 150 MG 24 hr tablet Take 1 tablet (150 mg total) by mouth in the morning.   clotrimazole-betamethasone (LOTRISONE) cream Apply 1 Application topically 2 (two) times daily.   cyclobenzaprine (FLEXERIL) 5 MG tablet Take 1 tablet (5 mg total) by mouth 3 (three) times daily as needed for muscle spasms.   gabapentin (NEURONTIN) 300 MG capsule TAKE ONE CAPSULE BY MOUTH AT BEDTIME   glucose blood (ONE TOUCH ULTRA TEST) test strip Check sugar daily   Lancets MISC Use Once Daily   levothyroxine (SYNTHROID) 50 MCG tablet Take 1 tablet by mouth once daily   lisinopril (ZESTRIL) 10 MG tablet Take 1 tablet by mouth once daily   lovastatin (MEVACOR) 40 MG  tablet Take 1 tablet by mouth once daily   PARoxetine (PAXIL) 40 MG tablet TAKE 1 & 1/2 (ONE & ONE-HALF) TABLETS BY MOUTH ONCE DAILY   traZODone (DESYREL) 100 MG tablet TAKE 1/2 TO 1 (ONE-HALF TO ONE) TABLET BY MOUTH ONCE DAILY AT BEDTIME   vitamin B-12 (CYANOCOBALAMIN) 1000 MCG tablet Take 1 tablet by mouth daily.   zolpidem (AMBIEN) 10 MG tablet Take 1 tablet (10 mg total) by mouth at bedtime as needed. for sleep   No facility-administered medications prior to visit.    Review of Systems  Constitutional:  Negative for appetite change, chills, fatigue and fever.  Respiratory:  Negative for chest tightness and shortness of breath.   Cardiovascular:  Negative for chest pain and palpitations.  Gastrointestinal:  Negative for abdominal pain, nausea and vomiting.  Neurological:  Negative for dizziness and weakness.       Objective    BP 120/73 (BP Location: Right Arm, Patient Position: Sitting, Cuff Size: Normal)   Pulse 87   Temp 97.6 F (36.4 C)   Ht '5\' 3"'$  (1.6 m)   Wt 127 lb (57.6 kg)   LMP 01/10/2015   SpO2 97%   BMI 22.50 kg/m    Physical Exam   General: Appearance:    Well developed, well nourished female in  no acute distress  Eyes:    PERRL, conjunctiva/corneas clear, EOM's intact       Lungs:     Clear to auscultation bilaterally, respirations unlabored  Heart:    Normal heart rate. Normal rhythm. No murmurs, rubs, or gallops.    MS:   All extremities are intact.    Neurologic:   Awake, alert, oriented x 3. No apparent focal neurological defect.         Assessment & Plan     1. Type 2 diabetes mellitus with hyperlipidemia ([place]) Now off of all diabetes medications.  - Hemoglobin A1c  2. Adult hypothyroidism Doing well with current dose of levothyroxine.  - T4, free - TSH  3. Primary hypertension Well controlled.  Continue current medications.   - CBC  4. Hyperlipidemia, unspecified hyperlipidemia type She is tolerating lovastatin well with no adverse  effects.   - Comprehensive metabolic panel - Lipid panel    flu vaccine given today     The entirety of the information documented in the History of Present Illness, Review of Systems and Physical Exam were personally obtained by me. Portions of this information were initially documented by the CMA and reviewed by me for thoroughness and accuracy.     Lelon Huh, MD  [place] [phone] (phone) [phone] (fax)  [place]

## 2022-10-12 DIAGNOSIS — E039 Hypothyroidism, unspecified: Secondary | ICD-10-CM | POA: Diagnosis not present

## 2022-10-12 DIAGNOSIS — E785 Hyperlipidemia, unspecified: Secondary | ICD-10-CM | POA: Diagnosis not present

## 2022-10-12 DIAGNOSIS — E1169 Type 2 diabetes mellitus with other specified complication: Secondary | ICD-10-CM | POA: Diagnosis not present

## 2022-10-12 DIAGNOSIS — E611 Iron deficiency: Secondary | ICD-10-CM | POA: Diagnosis not present

## 2022-10-13 LAB — LIPID PANEL
Chol/HDL Ratio: 3.1 ratio (ref 0.0–4.4)
Cholesterol, Total: 156 mg/dL (ref 100–199)
HDL: 51 mg/dL (ref >39)
LDL Chol Calc (NIH): 80 mg/dL (ref 0–99)
Triglycerides: 146 mg/dL (ref 0–149)
VLDL Cholesterol Cal: 25 mg/dL (ref 5–40)

## 2022-10-13 LAB — CBC
Hematocrit: 39.8 % (ref 34.0–46.6)
Hemoglobin: 12.9 g/dL (ref 11.1–15.9)
MCH: 30.6 pg (ref 26.6–33.0)
MCHC: 32.4 g/dL (ref 31.5–35.7)
MCV: 95 fL (ref 79–97)
Platelets: 293 10*3/uL (ref 150–450)
RBC: 4.21 x10E6/uL (ref 3.77–5.28)
RDW: 12.2 % (ref 11.7–15.4)
WBC: 7.4 10*3/uL (ref 3.4–10.8)

## 2022-10-13 LAB — COMPREHENSIVE METABOLIC PANEL
ALT: 21 IU/L (ref 0–32)
AST: 26 IU/L (ref 0–40)
Albumin/Globulin Ratio: 1.7 (ref 1.2–2.2)
Albumin: 3.9 g/dL (ref 3.9–4.9)
Alkaline Phosphatase: 95 IU/L (ref 44–121)
BUN/Creatinine Ratio: 19 (ref 12–28)
BUN: 18 mg/dL (ref 8–27)
Bilirubin Total: 0.3 mg/dL (ref 0.0–1.2)
CO2: 24 mmol/L (ref 20–29)
Calcium: 8.8 mg/dL (ref 8.7–10.3)
Chloride: 106 mmol/L (ref 96–106)
Creatinine, Ser: 0.97 mg/dL (ref 0.57–1.00)
Globulin, Total: 2.3 g/dL (ref 1.5–4.5)
Glucose: 125 mg/dL — ABNORMAL HIGH (ref 70–99)
Potassium: 4.4 mmol/L (ref 3.5–5.2)
Sodium: 143 mmol/L (ref 134–144)
Total Protein: 6.2 g/dL (ref 6.0–8.5)
eGFR: 66 mL/min/{1.73_m2} (ref >59)

## 2022-10-13 LAB — TSH: TSH: 4.99 u[IU]/mL — ABNORMAL HIGH (ref 0.450–4.500)

## 2022-10-13 LAB — T4, FREE: Free T4: 1.23 ng/dL (ref 0.82–1.77)

## 2022-10-13 LAB — HEMOGLOBIN A1C
Est. average glucose Bld gHb Est-mCnc: 128 mg/dL
Hgb A1c MFr Bld: 6.1 % — ABNORMAL HIGH (ref 4.8–5.6)

## 2022-11-06 ENCOUNTER — Other Ambulatory Visit: Payer: Self-pay | Admitting: Family Medicine

## 2022-11-06 DIAGNOSIS — F419 Anxiety disorder, unspecified: Secondary | ICD-10-CM

## 2022-11-06 NOTE — Telephone Encounter (Signed)
Medication Refill - Medication: PARoxetine (PAXIL) 40 MG tablet  buPROPion (WELLBUTRIN XL) 150 MG 24 hr tablet    Has the patient contacted their pharmacy? No.  [place].  Preferred Pharmacy (with phone number or street name):  [place], [place] Phone: [phone]  Fax: [phone]     Has the patient been seen for an appointment in the last year OR does the patient have an upcoming appointment? Yes.    Agent: Please be advised that RX refills may take up to 3 business days. We ask that you follow-up with your pharmacy.

## 2022-11-07 MED ORDER — BUPROPION HCL ER (XL) 150 MG PO TB24: 150.0000 mg | ORAL_TABLET | Freq: Every morning | ORAL | 0 refills | Status: DC

## 2022-11-07 MED ORDER — PAROXETINE HCL 40 MG PO TABS: ORAL_TABLET | ORAL | 0 refills | Status: DC

## 2022-11-07 NOTE — Telephone Encounter (Signed)
Patient is out of town for 30 days- will get RF when she returns- partner is very sick and is at end of life journey Requested Prescriptions  Pending Prescriptions Disp Refills   PARoxetine (PAXIL) 40 MG tablet 45 tablet 0    Sig: TAKE 1 & 1/2 (ONE & ONE-HALF) TABLETS BY MOUTH ONCE DAILY     Psychiatry:  Antidepressants - SSRI Passed - 11/06/2022 12:33 PM      Passed - Valid encounter within last 6 months    Recent Outpatient Visits           1 month ago Type 2 diabetes mellitus with hyperlipidemia ([place])   [place], Donald E, MD   6 months ago Type 2 diabetes mellitus with hyperlipidemia [facility])   [place] [name], MD   7 months ago Panic attack   [facility] [name], MD   8 months ago Adult hypothyroidism   [place] [name], MD   9 months ago Depression, unspecified depression type   [facility] [name], MD               buPROPion (WELLBUTRIN XL) 150 MG 24 hr tablet 30 tablet 0    Sig: Take 1 tablet (150 mg total) by mouth in the morning.     Psychiatry: Antidepressants - bupropion Passed - 11/06/2022 12:33 PM      Passed - Cr in normal range and within 360 days    Creat  Date Value Ref Range Status  05/27/2017 0.79 0.50 - 1.05 mg/dL Final    Comment:    For patients >39 years of age, the reference limit for Creatinine is approximately 13% higher for people identified as African-American. .    Creatinine, Ser  Date Value Ref Range Status  10/12/2022 0.97 0.57 - 1.00 mg/dL Final         Passed - AST in normal range and within 360 days    AST  Date Value Ref Range Status  10/12/2022 26 0 - 40 IU/L Final         Passed - ALT in normal range and within 360 days    ALT  Date Value Ref Range Status  10/12/2022 21 0 - 32 IU/L Final         Passed - Last BP in normal range    BP  Readings from Last 1 Encounters:  10/08/22 120/73         Passed - Valid encounter within last 6 months    Recent Outpatient Visits           1 month ago Type 2 diabetes mellitus with hyperlipidemia [facility])   [place] [name], MD   6 months ago Type 2 diabetes mellitus with hyperlipidemia [facility])   [place], Donald E, MD   7 months ago Panic attack   [facility] [name], MD   8 months ago Adult hypothyroidism   [place], Donald E, MD   9 months ago Depression, unspecified depression type   [facility] [name], MD

## 2022-12-03 ENCOUNTER — Other Ambulatory Visit: Payer: Self-pay | Admitting: Family Medicine

## 2022-12-03 DIAGNOSIS — F419 Anxiety disorder, unspecified: Secondary | ICD-10-CM

## 2022-12-04 ENCOUNTER — Other Ambulatory Visit: Payer: Self-pay | Admitting: Family Medicine

## 2022-12-04 DIAGNOSIS — F5101 Primary insomnia: Secondary | ICD-10-CM

## 2022-12-04 DIAGNOSIS — E039 Hypothyroidism, unspecified: Secondary | ICD-10-CM

## 2022-12-04 DIAGNOSIS — F419 Anxiety disorder, unspecified: Secondary | ICD-10-CM

## 2022-12-04 NOTE — Telephone Encounter (Unsigned)
Copied from [place] [id number]. Topic: General - Other >> Dec 04, 2022  4:34 PM Everette C wrote: Reason for CRM: Medication Refill - Medication: levothyroxine (SYNTHROID) 50 MCG tablet [id number]  PARoxetine (PAXIL) 40 MG tablet [id number]  buPROPion (WELLBUTRIN XL) 150 MG 24 hr tablet [id number]  traZODone (DESYREL) 100 MG tablet [id number]  Has the patient contacted their pharmacy? Yes.   (Agent: If no, request that the patient contact the pharmacy for the refill. If patient does not wish to contact the pharmacy document the reason why and proceed with request.) (Agent: If yes, when and what did the pharmacy advise?)  Preferred Pharmacy (with phone number or street name): [place], [place] [id number] [place] [address] Phone: [phone] Fax: [phone] Hours: Not open 24 hours   Has the patient been seen for an appointment in the last year OR does the patient have an upcoming appointment? Yes.    Agent: Please be advised that RX refills may take up to 3 business days. We ask that you follow-up with your pharmacy.

## 2022-12-05 ENCOUNTER — Other Ambulatory Visit: Payer: Self-pay | Admitting: Family Medicine

## 2022-12-05 MED ORDER — BUPROPION HCL ER (XL) 150 MG PO TB24: 150.0000 mg | ORAL_TABLET | Freq: Every morning | ORAL | 0 refills | Status: DC

## 2022-12-05 MED ORDER — PAROXETINE HCL 40 MG PO TABS: ORAL_TABLET | ORAL | 0 refills | Status: DC

## 2022-12-05 MED ORDER — LEVOTHYROXINE SODIUM 50 MCG PO TABS: 50.0000 ug | ORAL_TABLET | Freq: Every day | ORAL | 0 refills | Status: DC

## 2022-12-05 MED ORDER — TRAZODONE HCL 100 MG PO TABS: ORAL_TABLET | ORAL | 0 refills | Status: DC

## 2022-12-05 NOTE — Telephone Encounter (Signed)
Requested Prescriptions  Pending Prescriptions Disp Refills   levothyroxine (SYNTHROID) 50 MCG tablet 90 tablet 0    Sig: Take 1 tablet (50 mcg total) by mouth daily.     Endocrinology:  Hypothyroid Agents Failed - 12/04/2022  4:51 PM      Failed - TSH in normal range and within 360 days    TSH  Date Value Ref Range Status  10/12/2022 4.990 (H) 0.450 - 4.500 uIU/mL Final         Passed - Valid encounter within last 12 months    Recent Outpatient Visits           1 month ago Type 2 diabetes mellitus with hyperlipidemia ([place])   [place], Donald E, MD   6 months ago Type 2 diabetes mellitus with hyperlipidemia [facility])   [place], Donald E, MD   8 months ago Panic attack   [facility] [name], MD   9 months ago Adult hypothyroidism   [place], Donald E, MD   10 months ago Depression, unspecified depression type   [facility] [name], MD               PARoxetine (PAXIL) 40 MG tablet 90 tablet 0    Sig: TAKE 1 & 1/2 (ONE & ONE-HALF) TABLETS BY MOUTH ONCE DAILY     Psychiatry:  Antidepressants - SSRI Passed - 12/04/2022  4:51 PM      Passed - Valid encounter within last 6 months    Recent Outpatient Visits           1 month ago Type 2 diabetes mellitus with hyperlipidemia ([place])   [place], Donald E, MD   6 months ago Type 2 diabetes mellitus with hyperlipidemia [facility])   [place] [name], MD   8 months ago Panic attack   [facility] [name], MD   9 months ago Adult hypothyroidism   [place] [name], MD   10 months ago Depression, unspecified depression type   [facility] [name], MD               buPROPion  (WELLBUTRIN XL) 150 MG 24 hr tablet 90 tablet 0    Sig: Take 1 tablet (150 mg total) by mouth in the morning.     Psychiatry: Antidepressants - bupropion Passed - 12/04/2022  4:51 PM      Passed - Cr in normal range and within 360 days    Creat  Date Value Ref Range Status  05/27/2017 0.79 0.50 - 1.05 mg/dL Final    Comment:    For patients >40 years of age, the reference limit for Creatinine is approximately 13% higher for people identified as African-American. .    Creatinine, Ser  Date Value Ref Range Status  10/12/2022 0.97 0.57 - 1.00 mg/dL Final         Passed - AST in normal range and within 360 days    AST  Date Value Ref Range Status  10/12/2022 26 0 - 40 IU/L Final         Passed - ALT in normal range and within 360 days    ALT  Date Value Ref Range Status  10/12/2022 21 0 - 32 IU/L Final  Passed - Last BP in normal range    BP Readings from Last 1 Encounters:  10/08/22 120/73         Passed - Valid encounter within last 6 months    Recent Outpatient Visits           1 month ago Type 2 diabetes mellitus with hyperlipidemia ([place])   [place], Donald E, MD   6 months ago Type 2 diabetes mellitus with hyperlipidemia [facility])   [place], Donald E, MD   8 months ago Panic attack   [facility] [name], MD   9 months ago Adult hypothyroidism   [place], Donald E, MD   10 months ago Depression, unspecified depression type   [facility] [name], MD               traZODone (DESYREL) 100 MG tablet 90 tablet 0    Sig: TAKE 1/2 TO 1 (ONE-HALF TO ONE) TABLET BY MOUTH ONCE DAILY AT BEDTIME     Psychiatry: Antidepressants - Serotonin Modulator Passed - 12/04/2022  4:51 PM      Passed - Valid encounter within last 6 months    Recent Outpatient Visits           1 month ago  Type 2 diabetes mellitus with hyperlipidemia [facility])   [place] [name], MD   6 months ago Type 2 diabetes mellitus with hyperlipidemia [facility])   [place] [name], MD   8 months ago Panic attack   [facility] [name], MD   9 months ago Adult hypothyroidism   [place], Donald E, MD   10 months ago Depression, unspecified depression type   [facility] [name], MD

## 2023-01-18 ENCOUNTER — Other Ambulatory Visit: Payer: Self-pay | Admitting: Family Medicine

## 2023-01-18 DIAGNOSIS — F419 Anxiety disorder, unspecified: Secondary | ICD-10-CM

## 2023-01-18 NOTE — Telephone Encounter (Signed)
Requested medication (s) are due for refill today: yes  Requested medication (s) are on the active medication list: yes  Last refill:  12/03/22 #30/0  Future visit scheduled: no  Notes to clinic:  Unable to refill per protocol, cannot delegate.    Requested Prescriptions  Pending Prescriptions Disp Refills   ALPRAZolam (XANAX) 0.25 MG tablet [Pharmacy Med Name: ALPRAZolam 0.25 MG Oral Tablet] 30 tablet 0    Sig: Take 1 tablet by mouth twice daily as needed for anxiety     Not Delegated - Psychiatry: Anxiolytics/Hypnotics 2 Failed - 01/18/2023  3:34 PM      Failed - This refill cannot be delegated      Failed - Urine Drug Screen completed in last 360 days      Passed - Patient is not pregnant      Passed - Valid encounter within last 6 months    Recent Outpatient Visits           3 months ago Type 2 diabetes mellitus with hyperlipidemia ([facility])   [place] Malva Limes, MD   8 months ago Type 2 diabetes mellitus with hyperlipidemia [facility])   [facility] Malva Limes, MD   9 months ago Panic attack   [facility] Malva Limes, MD   11 months ago Adult hypothyroidism   [facility] Malva Limes, MD   11 months ago Depression, unspecified depression type   [facility] Malva Limes, MD

## 2023-01-31 ENCOUNTER — Other Ambulatory Visit: Payer: Self-pay | Admitting: Family Medicine

## 2023-01-31 NOTE — Telephone Encounter (Signed)
Requested Prescriptions  Pending Prescriptions Disp Refills   PARoxetine (PAXIL) 40 MG tablet [Pharmacy Med Name: PARoxetine HCl 40 MG Oral Tablet] 90 tablet 0    Sig: TAKE 1 & 1/2 (ONE & ONE-HALF) TABLETS BY MOUTH ONCE DAILY     Psychiatry:  Antidepressants - SSRI Passed - 01/31/2023 10:22 AM      Passed - Valid encounter within last 6 months    Recent Outpatient Visits           3 months ago Type 2 diabetes mellitus with hyperlipidemia [facility])   [place] [facility] Malva Limes, MD   8 months ago Type 2 diabetes mellitus with hyperlipidemia [facility])   [place] [facility] Malva Limes, MD   10 months ago Panic attack   [facility] Malva Limes, MD   11 months ago Adult hypothyroidism   [place] [facility] Malva Limes, MD   11 months ago Depression, unspecified depression type   [facility] Malva Limes, MD

## 2023-03-31 ENCOUNTER — Other Ambulatory Visit: Payer: Self-pay | Admitting: Family Medicine

## 2023-03-31 DIAGNOSIS — F5101 Primary insomnia: Secondary | ICD-10-CM

## 2023-04-01 ENCOUNTER — Other Ambulatory Visit: Payer: Self-pay | Admitting: Family Medicine

## 2023-04-01 DIAGNOSIS — F5101 Primary insomnia: Secondary | ICD-10-CM

## 2023-04-01 NOTE — Telephone Encounter (Signed)
Copied from CRM [id number]. Topic: General - Other >> Apr 01, 2023 11:54 AM Dondra Prader E wrote: Reason for CRM: Pt called to check status of refill, says her fiance has stage IV and that she needs this refill today. I explained that her PCP is allotted up to 72 hours for refills.

## 2023-04-29 ENCOUNTER — Other Ambulatory Visit: Payer: Self-pay | Admitting: Family Medicine

## 2023-04-29 DIAGNOSIS — F5101 Primary insomnia: Secondary | ICD-10-CM

## 2023-04-29 NOTE — Telephone Encounter (Signed)
Medication Refill - Medication: zolpidem (AMBIEN) 10 MG tablet [Pharmacy Med Name: Zolpidem Tartrate 10 MG Oral Tablet  Pt states needs , she is all out. Transmission didn't go thru before , according to notes last month  Has the patient contacted their pharmacy? yes (Agent: If no, request that the patient contact the pharmacy for the refill. If patient does not wish to contact the pharmacy document the reason why and proceed with request.) (Agent: If yes, when and what did the pharmacy advise?)contact pcp  Preferred Pharmacy (with phone number or street name):  [facility] [place], [place] - [address] Phone: [phone]  Fax: [phone]     Has the patient been seen for an appointment in the last year OR does the patient have an upcoming appointment? yes  Agent: Please be advised that RX refills may take up to 3 business days. We ask that you follow-up with your pharmacy.

## 2023-05-01 NOTE — Telephone Encounter (Signed)
Requested medication (s) are due for refill today: yes  Requested medication (s) are on the active medication list: yes  Last refill:  04/01/23  Future visit scheduled: no  Notes to clinic:  Unable to refill per protocol, cannot delegate.      Requested Prescriptions  Pending Prescriptions Disp Refills   zolpidem (AMBIEN) 10 MG tablet 30 tablet 0    Sig: Take 1 tablet (10 mg total) by mouth at bedtime as needed. for sleep     Not Delegated - Psychiatry:  Anxiolytics/Hypnotics Failed - 04/29/2023  5:34 PM      Failed - This refill cannot be delegated      Failed - Urine Drug Screen completed in last 360 days      Failed - Valid encounter within last 6 months    Recent Outpatient Visits           6 months ago Type 2 diabetes mellitus with hyperlipidemia [facility])   [facility] Malva Limes, MD   11 months ago Type 2 diabetes mellitus with hyperlipidemia [facility])   [place] [facility] Malva Limes, MD   1 year ago Panic attack   [facility] Malva Limes, MD   1 year ago Adult hypothyroidism   [place] [facility] Malva Limes, MD   1 year ago Depression, unspecified depression type   [facility] Malva Limes, MD

## 2023-05-03 ENCOUNTER — Ambulatory Visit (INDEPENDENT_AMBULATORY_CARE_PROVIDER_SITE_OTHER): Payer: BC Managed Care – PPO | Admitting: Family Medicine

## 2023-05-03 ENCOUNTER — Encounter: Payer: Self-pay | Admitting: Family Medicine

## 2023-05-03 VITALS — BP 126/66 | HR 75 | Ht 63.0 in | Wt 138.1 lb

## 2023-05-03 DIAGNOSIS — E785 Hyperlipidemia, unspecified: Secondary | ICD-10-CM

## 2023-05-03 DIAGNOSIS — E039 Hypothyroidism, unspecified: Secondary | ICD-10-CM

## 2023-05-03 DIAGNOSIS — E1169 Type 2 diabetes mellitus with other specified complication: Secondary | ICD-10-CM | POA: Diagnosis not present

## 2023-05-03 DIAGNOSIS — M791 Myalgia, unspecified site: Secondary | ICD-10-CM

## 2023-05-03 DIAGNOSIS — Z5320 Procedure and treatment not carried out because of patient's decision for unspecified reasons: Secondary | ICD-10-CM

## 2023-05-03 DIAGNOSIS — Z1231 Encounter for screening mammogram for malignant neoplasm of breast: Secondary | ICD-10-CM

## 2023-05-03 DIAGNOSIS — F5101 Primary insomnia: Secondary | ICD-10-CM | POA: Diagnosis not present

## 2023-05-03 DIAGNOSIS — R5383 Other fatigue: Secondary | ICD-10-CM

## 2023-05-03 MED ORDER — ZOLPIDEM TARTRATE 10 MG PO TABS
10.0000 mg | ORAL_TABLET | Freq: Every evening | ORAL | 0 refills | Status: DC | PRN
Start: 2023-05-03 — End: 2023-05-29

## 2023-05-03 NOTE — Patient Instructions (Signed)
Please review the attached list of medications and notify my office if there are any errors.  ? ? Please go to the lab draw station in [address] on the second floor of [facility] . Normal hours are 8:00am to 11:30am and 1:00pm to 4:00pm Monday through Friday  ? ?Please call the [facility] [phone]) to schedule a routine screening mammogram.  ?

## 2023-05-06 DIAGNOSIS — E1169 Type 2 diabetes mellitus with other specified complication: Secondary | ICD-10-CM | POA: Diagnosis not present

## 2023-05-06 DIAGNOSIS — M791 Myalgia, unspecified site: Secondary | ICD-10-CM | POA: Diagnosis not present

## 2023-05-06 DIAGNOSIS — E039 Hypothyroidism, unspecified: Secondary | ICD-10-CM | POA: Diagnosis not present

## 2023-05-06 DIAGNOSIS — E785 Hyperlipidemia, unspecified: Secondary | ICD-10-CM | POA: Diagnosis not present

## 2023-05-06 NOTE — Progress Notes (Signed)
Established patient visit   Patient: Barbara Harper   DOB: 10-31-1960   62 y.o. Female  MRN: [id number] Visit Date: 05/03/2023  Today's healthcare provider: Mila Merry, MD   Chief Complaint  Patient presents with   Medication Refill   Medical Management of Chronic Issues   Subjective    Discussed the use of AI scribe software for clinical note transcription with the patient, who gave verbal consent to proceed.  History of Present Illness   The patient presents with multiple physical complaints, including aches and pains in the elbow and leg, extending from the hip to the ankle. The discomfort is described as muscular, with some relief from ibuprofen and topical treatments. The patient also reports weight gain and increased fatigue, which she attributes to potential thyroid or diabetes issues. She has a history of diabetes, previously managed with Ozempic, but has not been on any diabetes medication since discontinuing Ozempic. The patient also reports worsening vision, with a noted increase in prescription strength for both distance and close-up reading glasses.  The patient is currently managing significant personal stress, as her fianc has a terminal prostate cancer diagnosis. She has been [place] beach, [place] per her fianc's wishes, and only returns home for medical and dental appointments. The patient's sleep is managed with Zolpidem, taken nightly, and occasionally substituted with Trazodone when the former is unavailable. The patient has requested a refill of Zolpidem.       Medications: Outpatient Medications Prior to Visit  Medication Sig   ALPRAZolam (XANAX) 0.25 MG tablet Take 1 tablet by mouth twice daily as needed for anxiety   aspirin 81 MG tablet Take 81 mg by mouth daily.   Blood Glucose Monitoring Suppl (ONE TOUCH ULTRA 2) w/Device KIT Use to check sugar daily   buPROPion (WELLBUTRIN XL) 150 MG 24 hr tablet Take 1 tablet (150 mg total) by  mouth in the morning.   clotrimazole-betamethasone (LOTRISONE) cream Apply 1 Application topically 2 (two) times daily.   cyclobenzaprine (FLEXERIL) 5 MG tablet Take 1 tablet (5 mg total) by mouth 3 (three) times daily as needed for muscle spasms.   gabapentin (NEURONTIN) 300 MG capsule TAKE ONE CAPSULE BY MOUTH AT BEDTIME   glucose blood (ONE TOUCH ULTRA TEST) test strip Check sugar daily   Lancets MISC Use Once Daily   levothyroxine (SYNTHROID) 50 MCG tablet Take 1 tablet (50 mcg total) by mouth daily.   lisinopril (ZESTRIL) 10 MG tablet Take 1 tablet by mouth once daily   lovastatin (MEVACOR) 40 MG tablet Take 1 tablet by mouth once daily   PARoxetine (PAXIL) 40 MG tablet TAKE 1 & 1/2 (ONE & ONE-HALF) TABLETS BY MOUTH ONCE DAILY   traZODone (DESYREL) 100 MG tablet TAKE 1/2 TO 1 (ONE-HALF TO ONE) TABLET BY MOUTH ONCE DAILY AT BEDTIME   vitamin B-12 (CYANOCOBALAMIN) 1000 MCG tablet Take 1 tablet by mouth daily.   [DISCONTINUED] zolpidem (AMBIEN) 10 MG tablet TAKE 1 TABLET BY MOUTH AT BEDTIME AS NEEDED FOR SLEEP   No facility-administered medications prior to visit.      Objective    BP 126/66 (BP Location: Right Arm, Patient Position: Sitting, Cuff Size: Normal)   Pulse 75   Ht 5\' 3"  (1.6 m)   Wt 138 lb 1.6 oz (62.6 kg)   LMP 01/10/2015   SpO2 100%   BMI 24.46 kg/m   Physical Exam    General: Appearance:    Well  developed, well nourished female in no acute distress  Eyes:    PERRL, conjunctiva/corneas clear, EOM's intact       Lungs:     Clear to auscultation bilaterally, respirations unlabored  Heart:    Normal heart rate. Normal rhythm. No murmurs, rubs, or gallops.    MS:   All extremities are intact.    Neurologic:   Awake, alert, oriented x 3. No apparent focal neurological defect.         Assessment & Plan     Assessment and Plan    Musculoskeletal Pain Reports of aches and pains in elbow and leg, more in muscles than joints. Responds to ibuprofen. -Continue  ibuprofen as needed.  Insomnia Reports of good response to Zolpidem, no morning grogginess. Occasionally substitutes with Trazodone. -Refill Zolpidem prescription.  Diabetes Mellitus Off Ozempic due to side effects. Reports weight gain and worsening vision. Concerns about thyroid function and blood glucose control. -Order labs including A1C, thyroid function tests, and complete blood count. -Consider starting Mounjaro if blood glucose control is suboptimal.  General Health Maintenance -Collect urine sample for uACR          Mila Merry, MD  [facility] [phone] (phone) [phone] (fax)  [facility]

## 2023-05-07 ENCOUNTER — Other Ambulatory Visit: Payer: Self-pay | Admitting: Family Medicine

## 2023-05-07 DIAGNOSIS — E039 Hypothyroidism, unspecified: Secondary | ICD-10-CM

## 2023-05-07 LAB — COMPREHENSIVE METABOLIC PANEL
ALT: 39 IU/L — ABNORMAL HIGH (ref 0–32)
AST: 42 IU/L — ABNORMAL HIGH (ref 0–40)
Albumin: 4.1 g/dL (ref 3.9–4.9)
Alkaline Phosphatase: 111 IU/L (ref 44–121)
BUN/Creatinine Ratio: 11 — ABNORMAL LOW (ref 12–28)
BUN: 12 mg/dL (ref 8–27)
Bilirubin Total: 0.2 mg/dL (ref 0.0–1.2)
CO2: 22 mmol/L (ref 20–29)
Calcium: 9.2 mg/dL (ref 8.7–10.3)
Chloride: 106 mmol/L (ref 96–106)
Creatinine, Ser: 1.13 mg/dL — ABNORMAL HIGH (ref 0.57–1.00)
Globulin, Total: 2.3 g/dL (ref 1.5–4.5)
Glucose: 123 mg/dL — ABNORMAL HIGH (ref 70–99)
Potassium: 4.2 mmol/L (ref 3.5–5.2)
Sodium: 143 mmol/L (ref 134–144)
Total Protein: 6.4 g/dL (ref 6.0–8.5)
eGFR: 55 mL/min/{1.73_m2} — ABNORMAL LOW (ref >59)

## 2023-05-07 LAB — CBC
Hematocrit: 37.9 % (ref 34.0–46.6)
Hemoglobin: 12.6 g/dL (ref 11.1–15.9)
MCH: 31.7 pg (ref 26.6–33.0)
MCHC: 33.2 g/dL (ref 31.5–35.7)
MCV: 95 fL (ref 79–97)
Platelets: 358 10*3/uL (ref 150–450)
RBC: 3.98 x10E6/uL (ref 3.77–5.28)
RDW: 11.6 % — ABNORMAL LOW (ref 11.7–15.4)
WBC: 7.5 10*3/uL (ref 3.4–10.8)

## 2023-05-07 LAB — HEMOGLOBIN A1C
Est. average glucose Bld gHb Est-mCnc: 131 mg/dL
Hgb A1c MFr Bld: 6.2 % — ABNORMAL HIGH (ref 4.8–5.6)

## 2023-05-07 LAB — MICROALBUMIN / CREATININE URINE RATIO
Creatinine, Urine: 62 mg/dL
Microalb/Creat Ratio: <5 mg/g{creat} (ref 0–29)
Microalbumin, Urine: <3 ug/mL

## 2023-05-07 LAB — CK: Total CK: 46 U/L (ref 32–182)

## 2023-05-07 LAB — TSH+FREE T4
Free T4: 0.91 ng/dL (ref 0.82–1.77)
TSH: 9.1 u[IU]/mL — ABNORMAL HIGH (ref 0.450–4.500)

## 2023-05-07 MED ORDER — LEVOTHYROXINE SODIUM 75 MCG PO TABS
75.0000 ug | ORAL_TABLET | Freq: Every day | ORAL | 1 refills | Status: AC
Start: 2023-05-07 — End: ?

## 2023-05-10 ENCOUNTER — Other Ambulatory Visit: Payer: Self-pay | Admitting: Family Medicine

## 2023-05-10 ENCOUNTER — Telehealth: Payer: Self-pay | Admitting: Family Medicine

## 2023-05-10 DIAGNOSIS — F419 Anxiety disorder, unspecified: Secondary | ICD-10-CM

## 2023-05-10 DIAGNOSIS — I10 Essential (primary) hypertension: Secondary | ICD-10-CM

## 2023-05-10 NOTE — Telephone Encounter (Signed)
Pt. Given lab results and instructions, verbalizes understanding. Appointment made for follow up.

## 2023-05-10 NOTE — Telephone Encounter (Signed)
Medication Refill - Medication: lisinopril (ZESTRIL) 10 MG tablet and buPROPion (WELLBUTRIN XL) 150 MG 24 hr tablet   Has the patient contacted their pharmacy? No.   Preferred Pharmacy (with phone number or street name):  [facility] [place], [place] - [address] Phone: [phone]  Fax: [phone]     Has the patient been seen for an appointment in the last year OR does the patient have an upcoming appointment? Yes.    Agent: Please be advised that RX refills may take up to 3 business days. We ask that you follow-up with your pharmacy.

## 2023-05-11 ENCOUNTER — Other Ambulatory Visit: Payer: Self-pay | Admitting: Family Medicine

## 2023-05-11 DIAGNOSIS — F419 Anxiety disorder, unspecified: Secondary | ICD-10-CM

## 2023-05-13 MED ORDER — BUPROPION HCL ER (XL) 150 MG PO TB24
150.0000 mg | ORAL_TABLET | Freq: Every morning | ORAL | 0 refills | Status: DC
Start: 2023-05-13 — End: 2023-05-14

## 2023-05-13 MED ORDER — LISINOPRIL 10 MG PO TABS
10.0000 mg | ORAL_TABLET | Freq: Every day | ORAL | 1 refills | Status: AC
Start: 2023-05-13 — End: ?

## 2023-05-13 NOTE — Telephone Encounter (Signed)
Requested Prescriptions  Pending Prescriptions Disp Refills   buPROPion (WELLBUTRIN XL) 150 MG 24 hr tablet 90 tablet 0    Sig: Take 1 tablet (150 mg total) by mouth in the morning.     Psychiatry: Antidepressants - bupropion Failed - 05/10/2023  3:52 PM      Failed - Cr in normal range and within 360 days    Creat  Date Value Ref Range Status  05/27/2017 0.79 0.50 - 1.05 mg/dL Final    Comment:    For patients >63 years of age, the reference limit for Creatinine is approximately 13% higher for people identified as African-American. .    Creatinine, Ser  Date Value Ref Range Status  05/06/2023 1.13 (H) 0.57 - 1.00 mg/dL Final         Failed - AST in normal range and within 360 days    AST  Date Value Ref Range Status  05/06/2023 42 (H) 0 - 40 IU/L Final         Failed - ALT in normal range and within 360 days    ALT  Date Value Ref Range Status  05/06/2023 39 (H) 0 - 32 IU/L Final         Passed - Last BP in normal range    BP Readings from Last 1 Encounters:  05/03/23 126/66         Passed - Valid encounter within last 6 months    Recent Outpatient Visits           1 week ago Type 2 diabetes mellitus with hyperlipidemia (HCC)   [place] [facility] Malva Limes, MD   7 months ago Type 2 diabetes mellitus with hyperlipidemia [facility])   [place] [facility] Malva Limes, MD   1 year ago Type 2 diabetes mellitus with hyperlipidemia [facility])   [place] [facility] Malva Limes, MD   1 year ago Panic attack   [facility] Malva Limes, MD   1 year ago Adult hypothyroidism   [place] [facility] Malva Limes, MD       Future Appointments             In 2 months [name], Demetrios Isaacs, MD [facility]             lisinopril (ZESTRIL) 10 MG tablet 90 tablet 1    Sig: Take 1 tablet (10 mg total) by mouth daily.      Cardiovascular:  ACE Inhibitors Failed - 05/10/2023  3:52 PM      Failed - Cr in normal range and within 180 days    Creat  Date Value Ref Range Status  05/27/2017 0.79 0.50 - 1.05 mg/dL Final    Comment:    For patients >31 years of age, the reference limit for Creatinine is approximately 13% higher for people identified as African-American. .    Creatinine, Ser  Date Value Ref Range Status  05/06/2023 1.13 (H) 0.57 - 1.00 mg/dL Final         Passed - K in normal range and within 180 days    Potassium  Date Value Ref Range Status  05/06/2023 4.2 3.5 - 5.2 mmol/L Final         Passed - Patient is not pregnant      Passed - Last BP in normal range    BP Readings from Last 1 Encounters:  05/03/23 126/66         Passed - Valid encounter within last 6 months    Recent Outpatient Visits           1 week ago Type 2 diabetes mellitus with hyperlipidemia [facility])   [place] [facility] Malva Limes, MD   7 months ago Type 2 diabetes mellitus with hyperlipidemia [facility])   [place] [facility] Malva Limes, MD   1 year ago Type 2 diabetes mellitus with hyperlipidemia [facility])   [place] [facility] Malva Limes, MD   1 year ago Panic attack   [facility] Malva Limes, MD   1 year ago Adult hypothyroidism   [place] [facility] Malva Limes, MD       Future Appointments             In 2 months [name], Demetrios Isaacs, MD [facility]

## 2023-05-14 MED ORDER — BUPROPION HCL ER (XL) 150 MG PO TB24
150.0000 mg | ORAL_TABLET | Freq: Every morning | ORAL | 0 refills | Status: DC
Start: 2023-05-14 — End: 2023-08-01

## 2023-05-14 NOTE — Telephone Encounter (Signed)
Requested Prescriptions  Refused Prescriptions Disp Refills   buPROPion (WELLBUTRIN XL) 150 MG 24 hr tablet [Pharmacy Med Name: buPROPion HCl ER (XL) 150 MG Oral Tablet Extended Release 24 Hour] 30 tablet 0    Sig: TAKE 1 TABLET BY MOUTH IN THE MORNING     Psychiatry: Antidepressants - bupropion Failed - 05/11/2023  4:42 PM      Failed - Cr in normal range and within 360 days    Creat  Date Value Ref Range Status  05/27/2017 0.79 0.50 - 1.05 mg/dL Final    Comment:    For patients >28 years of age, the reference limit for Creatinine is approximately 13% higher for people identified as African-American. .    Creatinine, Ser  Date Value Ref Range Status  05/06/2023 1.13 (H) 0.57 - 1.00 mg/dL Final         Failed - AST in normal range and within 360 days    AST  Date Value Ref Range Status  05/06/2023 42 (H) 0 - 40 IU/L Final         Failed - ALT in normal range and within 360 days    ALT  Date Value Ref Range Status  05/06/2023 39 (H) 0 - 32 IU/L Final         Passed - Last BP in normal range    BP Readings from Last 1 Encounters:  05/03/23 126/66         Passed - Valid encounter within last 6 months    Recent Outpatient Visits           1 week ago Type 2 diabetes mellitus with hyperlipidemia (HCC)   [place] [facility] Malva Limes, MD   7 months ago Type 2 diabetes mellitus with hyperlipidemia [facility])   [place] [facility] Malva Limes, MD   1 year ago Type 2 diabetes mellitus with hyperlipidemia [facility])   [place] [facility] Malva Limes, MD   1 year ago Panic attack   [facility] Malva Limes, MD   1 year ago Adult hypothyroidism   [place] [facility] Malva Limes, MD       Future Appointments             In 2 months [name], Demetrios Isaacs, MD [facility], [facility]

## 2023-05-14 NOTE — Addendum Note (Signed)
Addended by: Malva Limes on: 05/14/2023 08:39 AM   Modules accepted: Orders

## 2023-05-18 ENCOUNTER — Other Ambulatory Visit: Payer: Self-pay | Admitting: Family Medicine

## 2023-05-18 DIAGNOSIS — F419 Anxiety disorder, unspecified: Secondary | ICD-10-CM

## 2023-05-22 ENCOUNTER — Other Ambulatory Visit: Payer: Self-pay | Admitting: Family Medicine

## 2023-05-23 NOTE — Telephone Encounter (Signed)
Requested Prescriptions  Pending Prescriptions Disp Refills   PARoxetine (PAXIL) 40 MG tablet [Pharmacy Med Name: PARoxetine HCl 40 MG Oral Tablet] 135 tablet 1    Sig: TAKE 1 & 1/2 (ONE & ONE-HALF) TABLETS BY MOUTH ONCE DAILY     Psychiatry:  Antidepressants - SSRI Passed - 05/22/2023  1:41 PM      Passed - Valid encounter within last 6 months    Recent Outpatient Visits           2 weeks ago Type 2 diabetes mellitus with hyperlipidemia (HCC)   [place] [facility] Malva Limes, MD   7 months ago Type 2 diabetes mellitus with hyperlipidemia [facility])   [place] [facility] Malva Limes, MD   1 year ago Type 2 diabetes mellitus with hyperlipidemia [facility])   [place] [facility] Malva Limes, MD   1 year ago Panic attack   [facility] Malva Limes, MD   1 year ago Adult hypothyroidism   [place] [facility] Malva Limes, MD       Future Appointments             In 2 months [name], Demetrios Isaacs, MD [facility], [facility]

## 2023-05-28 ENCOUNTER — Other Ambulatory Visit: Payer: Self-pay | Admitting: Family Medicine

## 2023-05-28 DIAGNOSIS — F5101 Primary insomnia: Secondary | ICD-10-CM

## 2023-06-06 ENCOUNTER — Other Ambulatory Visit: Payer: Self-pay | Admitting: Family Medicine

## 2023-06-06 DIAGNOSIS — F5101 Primary insomnia: Secondary | ICD-10-CM

## 2023-06-10 NOTE — Telephone Encounter (Signed)
Requested Prescriptions  Pending Prescriptions Disp Refills   traZODone (DESYREL) 100 MG tablet [Pharmacy Med Name: traZODone HCl 100 MG Oral Tablet] 90 tablet 0    Sig: TAKE 1/2 TO 1 (ONE-HALF TO ONE) TABLET BY MOUTH ONCE DAILY AT BEDTIME     Psychiatry: Antidepressants - Serotonin Modulator Passed - 06/06/2023  6:58 PM      Passed - Valid encounter within last 6 months    Recent Outpatient Visits           1 month ago Type 2 diabetes mellitus with hyperlipidemia (HCC)   [place] [facility] Malva Limes, MD   8 months ago Type 2 diabetes mellitus with hyperlipidemia [facility])   [place] [facility] Limes, MD   1 year ago Type 2 diabetes mellitus with hyperlipidemia [facility])   [place] [facility] Malva Limes, MD   1 year ago Panic attack   [facility] Malva Limes, MD   1 year ago Adult hypothyroidism   [place] [facility] Malva Limes, MD       Future Appointments             In 1 month [name], Demetrios Isaacs, MD [facility], [facility]

## 2023-07-01 ENCOUNTER — Other Ambulatory Visit: Payer: Self-pay | Admitting: Family Medicine

## 2023-07-01 DIAGNOSIS — F5101 Primary insomnia: Secondary | ICD-10-CM

## 2023-07-01 NOTE — Telephone Encounter (Signed)
Medication Refill - Medication: zolpidem (AMBIEN) 10 MG tablet [[id number]]   Has the patient contacted their pharmacy? Yes.   (Agent: If no, request that the patient contact the pharmacy for the refill. If patient does not wish to contact the pharmacy document the reason why and proceed with request.) (Agent: If yes, when and what did the pharmacy advise?)  Preferred Pharmacy (with phone number or street name):  [facility] [address] [place], [place] - [address] Phone: [phone]  Fax: [phone]     Has the patient been seen for an appointment in the last year OR does the patient have an upcoming appointment? Yes.    Agent: Please be advised that RX refills may take up to 3 business days. We ask that you follow-up with your pharmacy.

## 2023-07-02 MED ORDER — ZOLPIDEM TARTRATE 10 MG PO TABS
10.0000 mg | ORAL_TABLET | Freq: Every evening | ORAL | 1 refills | Status: AC | PRN
Start: 2023-07-02 — End: ?

## 2023-07-02 NOTE — Telephone Encounter (Signed)
Requested medication (s) are due for refill today:   Provider to review  Requested medication (s) are on the active medication list:   Yes  Future visit scheduled:   Yes 11/18 with Dr. Sherrie Mustache   Last ordered: 05/29/2023 #30, 1 refill  Non delegated refill    Requested Prescriptions  Pending Prescriptions Disp Refills   zolpidem (AMBIEN) 10 MG tablet 30 tablet 1    Sig: Take 1 tablet (10 mg total) by mouth at bedtime as needed. for sleep     Not Delegated - Psychiatry:  Anxiolytics/Hypnotics Failed - 07/01/2023  8:56 AM      Failed - This refill cannot be delegated      Failed - Urine Drug Screen completed in last 360 days      Passed - Valid encounter within last 6 months    Recent Outpatient Visits           2 months ago Type 2 diabetes mellitus with hyperlipidemia (HCC)   [place] [facility], [facility]   8 months ago Type 2 diabetes mellitus with hyperlipidemia [facility])   [place] [facility] Malva Limes, MD   1 year ago Type 2 diabetes mellitus with hyperlipidemia [facility])   [place] [facility] Malva Limes, MD   1 year ago Panic attack   [facility] Malva Limes, MD   1 year ago Adult hypothyroidism   [place] [facility] Malva Limes, MD       Future Appointments             In 1 month [name], Demetrios Isaacs, MD [facility], [facility]

## 2023-07-21 ENCOUNTER — Other Ambulatory Visit: Payer: Self-pay | Admitting: Family Medicine

## 2023-07-21 DIAGNOSIS — F419 Anxiety disorder, unspecified: Secondary | ICD-10-CM

## 2023-07-31 ENCOUNTER — Other Ambulatory Visit: Payer: Self-pay | Admitting: Family Medicine

## 2023-07-31 DIAGNOSIS — F419 Anxiety disorder, unspecified: Secondary | ICD-10-CM

## 2023-08-01 NOTE — Telephone Encounter (Signed)
Requested medication (s) are due for refill today:yes  Requested medication (s) are on the active medication list: yes  Last refill:  05/14/23 #90  Future visit scheduled: yes  Notes to clinic:  pt was to come and get labs per last lab result not. Labs were not ordered that I can tell   Requested Prescriptions  Pending Prescriptions Disp Refills   buPROPion (WELLBUTRIN XL) 150 MG 24 hr tablet [Pharmacy Med Name: buPROPion HCl ER (XL) 150 MG Oral Tablet Extended Release 24 Hour] 90 tablet 0    Sig: TAKE 1 TABLET BY MOUTH IN THE MORNING     Psychiatry: Antidepressants - bupropion Failed - 07/31/2023  2:42 PM      Failed - Cr in normal range and within 360 days    Creat  Date Value Ref Range Status  05/27/2017 0.79 0.50 - 1.05 mg/dL Final    Comment:    For patients >39 years of age, the reference limit for Creatinine is approximately 13% higher for people identified as African-American. .    Creatinine, Ser  Date Value Ref Range Status  05/06/2023 1.13 (H) 0.57 - 1.00 mg/dL Final         Failed - AST in normal range and within 360 days    AST  Date Value Ref Range Status  05/06/2023 42 (H) 0 - 40 IU/L Final         Failed - ALT in normal range and within 360 days    ALT  Date Value Ref Range Status  05/06/2023 39 (H) 0 - 32 IU/L Final         Passed - Last BP in normal range    BP Readings from Last 1 Encounters:  05/03/23 126/66         Passed - Valid encounter within last 6 months    Recent Outpatient Visits           3 months ago Type 2 diabetes mellitus with hyperlipidemia [facility])   [place] [facility] Malva Limes, MD   9 months ago Type 2 diabetes mellitus with hyperlipidemia [facility])   [place] Malva Limes, MD   1 year ago Type 2 diabetes mellitus with hyperlipidemia [facility])   [place] [facility] Malva Limes, MD   1 year ago Panic attack   [facility]  Malva Limes, MD   1 year ago Adult hypothyroidism   [place] [facility] Malva Limes, MD       Future Appointments             In 4 days [name], Demetrios Isaacs, MD [facility], [facility]

## 2023-08-02 ENCOUNTER — Telehealth: Payer: Self-pay | Admitting: Family Medicine

## 2023-08-02 NOTE — Telephone Encounter (Signed)
Spoke to pt --stated unable to come in [place] and tried to offer re-schedule appt, but pt declined. Pt stated will just call back if needed to change the appt.

## 2023-08-02 NOTE — Telephone Encounter (Signed)
Pt is calling to report that she is now staying in [facility] with caring for her finance. Requesting approval for virtual apt from [facility]. [phone]

## 2023-08-02 NOTE — Telephone Encounter (Signed)
She needs labs done and I can't order labs in [place]

## 2023-08-05 ENCOUNTER — Ambulatory Visit: Payer: BC Managed Care – PPO | Admitting: Family Medicine

## 2023-08-06 NOTE — Telephone Encounter (Signed)
Pt called in about getting labs done. I let her know that Dr Sherrie Mustache states can't order labs for [facility], pt said that didnt make sense and wants a cb about it

## 2023-08-07 NOTE — Telephone Encounter (Signed)
She will have to find a lab that we can a fax an order to. Insurance may or may not cover it, we have no way of knowing.

## 2023-08-07 NOTE — Telephone Encounter (Signed)
This encounter was created in error - please disregard.

## 2023-10-31 ENCOUNTER — Other Ambulatory Visit: Payer: Self-pay | Admitting: Family Medicine

## 2023-10-31 DIAGNOSIS — F419 Anxiety disorder, unspecified: Secondary | ICD-10-CM

## 2023-10-31 NOTE — Telephone Encounter (Signed)
OV needed for additional refills, 30 day supply given until OV can be made.  Requested Prescriptions  Pending Prescriptions Disp Refills   buPROPion (WELLBUTRIN XL) 150 MG 24 hr tablet [Pharmacy Med Name: buPROPion HCl ER (XL) 150 MG Oral Tablet Extended Release 24 Hour] 30 tablet 0    Sig: TAKE 1 TABLET BY MOUTH IN THE MORNING     Psychiatry: Antidepressants - bupropion Failed - 10/31/2023  1:36 PM      Failed - Cr in normal range and within 360 days    Creat  Date Value Ref Range Status  05/27/2017 0.79 0.50 - 1.05 mg/dL Final    Comment:    For patients >38 years of age, the reference limit for Creatinine is approximately 13% higher for people identified as African-American. .    Creatinine, Ser  Date Value Ref Range Status  05/06/2023 1.13 (H) 0.57 - 1.00 mg/dL Final         Failed - AST in normal range and within 360 days    AST  Date Value Ref Range Status  05/06/2023 42 (H) 0 - 40 IU/L Final         Failed - ALT in normal range and within 360 days    ALT  Date Value Ref Range Status  05/06/2023 39 (H) 0 - 32 IU/L Final         Failed - Valid encounter within last 6 months    Recent Outpatient Visits           6 months ago Type 2 diabetes mellitus with hyperlipidemia (HCC)   [place] [facility] Malva Limes, MD   1 year ago Type 2 diabetes mellitus with hyperlipidemia [facility])   [place] [facility] Malva Limes, MD   1 year ago Type 2 diabetes mellitus with hyperlipidemia [facility])   [place] [facility] Malva Limes, MD   1 year ago Panic attack   [facility] Malva Limes, MD   1 year ago Adult hypothyroidism    [facility] Malva Limes, MD              Passed - Last BP in normal range    BP Readings from Last 1 Encounters:  05/03/23 126/66
